# Patient Record
Sex: Male | Born: 1960 | Race: White | Hispanic: No | Marital: Married | Smoking: Never smoker
Health system: Southern US, Community
[De-identification: ages and names within clinical notes are randomized; demographics above are authoritative.]

---

## 2008-10-27 ENCOUNTER — Encounter: Admission: RE | Admit: 2008-10-27 | Discharge: 2008-10-27 | Payer: Self-pay | Admitting: Internal Medicine

## 2008-12-12 ENCOUNTER — Ambulatory Visit (HOSPITAL_COMMUNITY): Admission: RE | Admit: 2008-12-12 | Discharge: 2008-12-12 | Payer: Self-pay | Admitting: Internal Medicine

## 2009-04-28 ENCOUNTER — Encounter: Admission: RE | Admit: 2009-04-28 | Discharge: 2009-04-28 | Payer: Self-pay | Admitting: Internal Medicine

## 2011-12-05 ENCOUNTER — Ambulatory Visit
Admission: RE | Admit: 2011-12-05 | Discharge: 2011-12-05 | Disposition: A | Discharge status: Home / Self Care | Payer: BC Managed Care – PPO | Source: Ambulatory Visit | Attending: Internal Medicine | Admitting: Internal Medicine

## 2011-12-05 ENCOUNTER — Other Ambulatory Visit: Payer: Self-pay | Admitting: Internal Medicine

## 2011-12-05 DIAGNOSIS — R221 Localized swelling, mass and lump, neck: Secondary | ICD-10-CM

## 2011-12-05 MED ORDER — IOHEXOL 300 MG/ML  SOLN
75.0000 mL | Freq: Once | INTRAMUSCULAR | Status: AC | PRN
  Administered 2011-12-05: 75 mL via INTRAVENOUS

## 2014-09-07 IMAGING — CT CT NECK W/ CM
3 of 6 series · 16 of 33 positions shown, 19 images · IV contrast (75CC OMNI 300)
Comparison: None.

CLINICAL DATA: Palpable mass.  Swelling of the neck.

CT NECK WITH CONTRAST
TECHNIQUE: Multidetector CT imaging of the neck was performed with
intravenous contrast.
Contrast: 75mL OMNIPAQUE IOHEXOL 300 MG/ML  SOLN

[Series 4: thin cuts · axial · 0.43mm/px · z∈[-258,-27]mm · 8 of 744 slices shown, 10 images]
[im 83/744  soft-tissue]
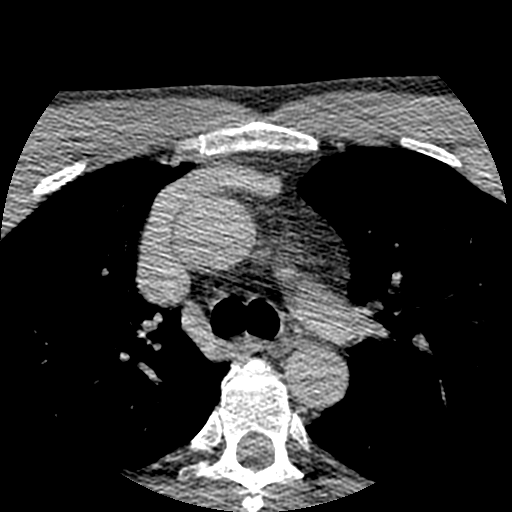
[im 83/744  bone]
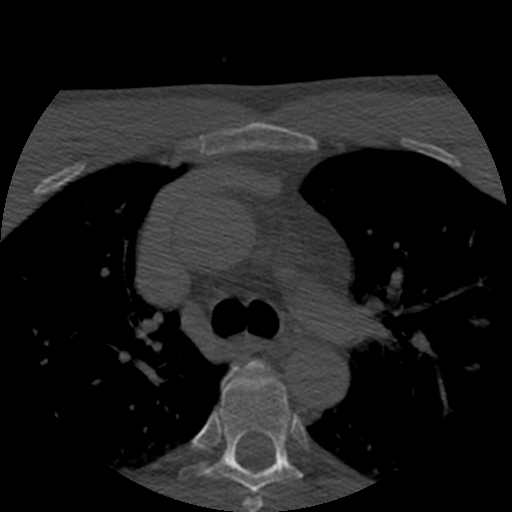
[im 166/744  bone]
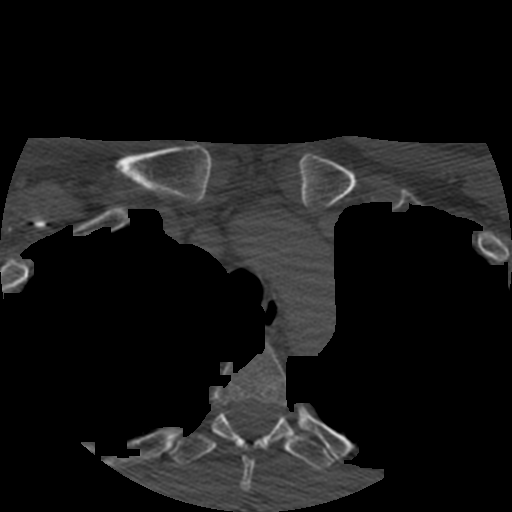
[im 248/744  bone]
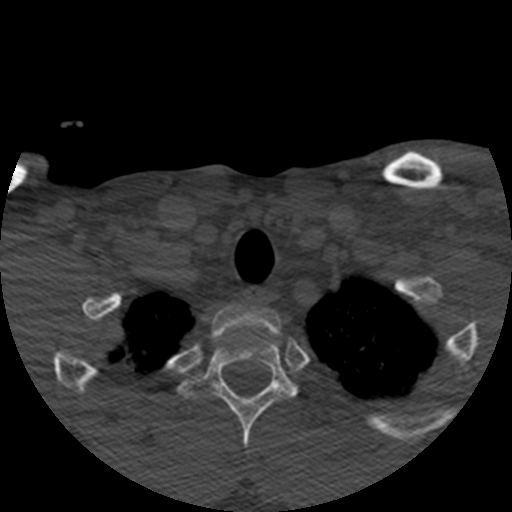
[im 331/744  bone]
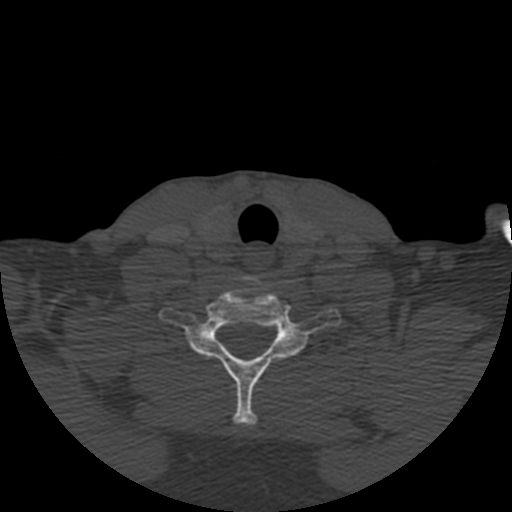
[im 413/744  soft-tissue]
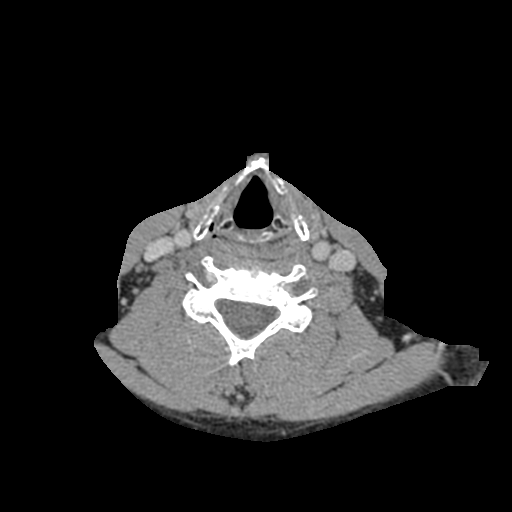
[im 413/744  bone]
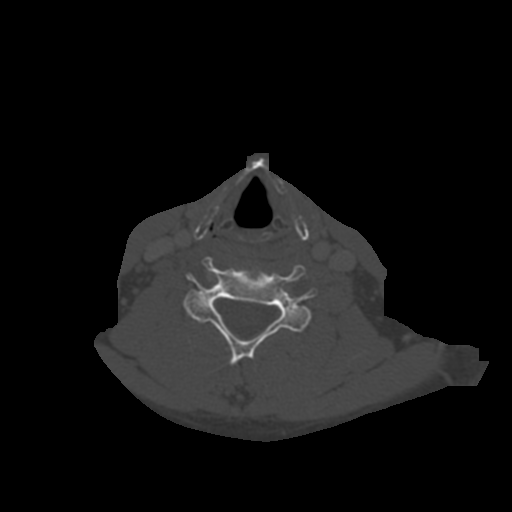
[im 496/744  bone]
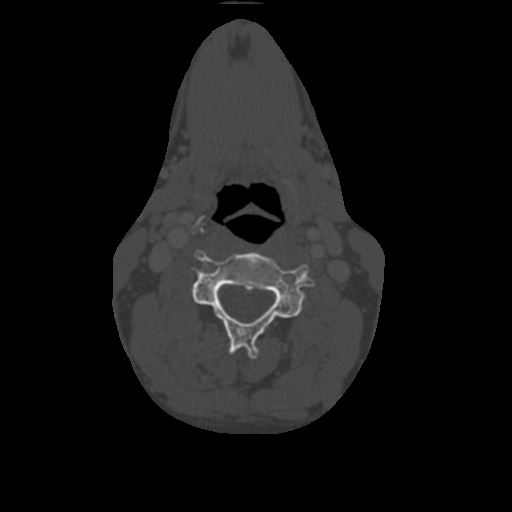
[im 578/744  bone]
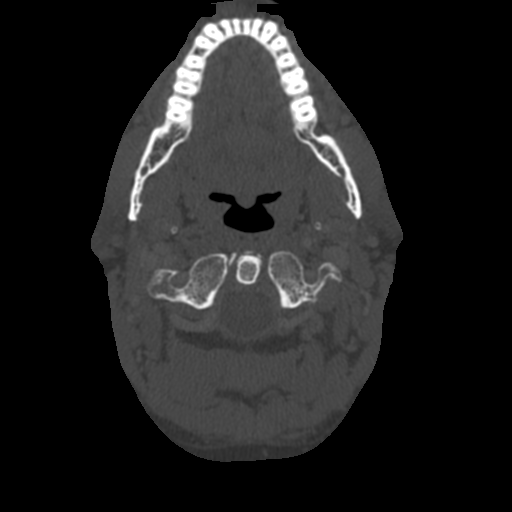
[im 661/744  bone]
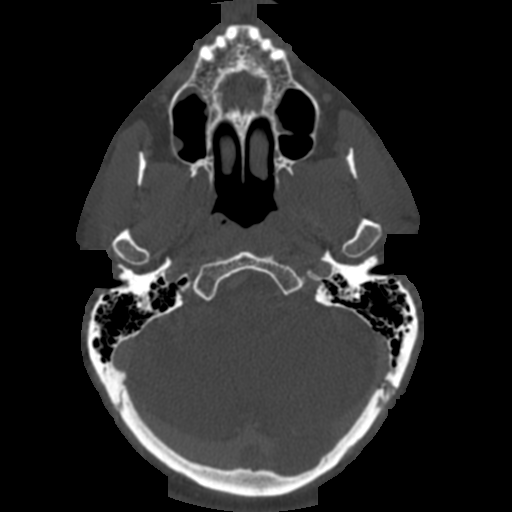

[Series 400: cor · coronal · 0.60mm/px · 3 of 118 slices shown]
[im 24/118  bone]
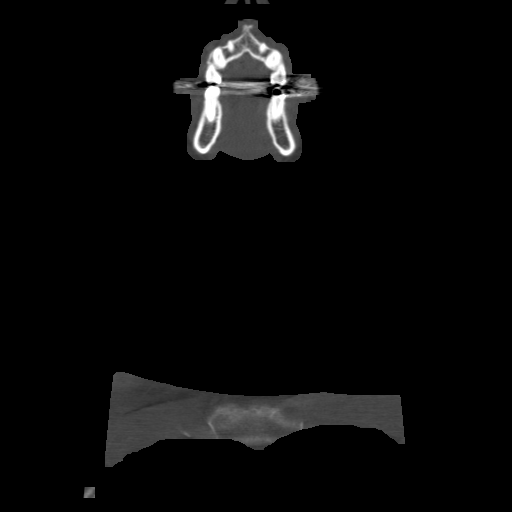
[im 47/118  bone]
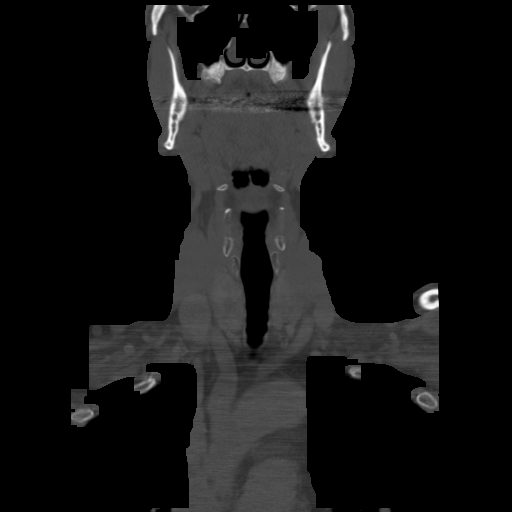
[im 71/118  bone]
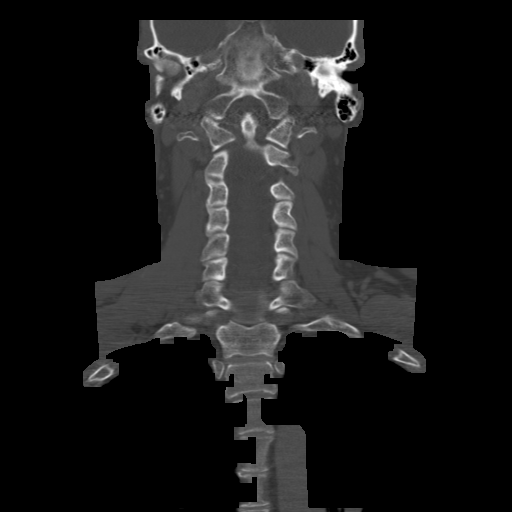

[Series 401: sag · sagittal · 0.60mm/px · 5 of 116 slices shown, 6 images]
[im 39/116  bone]
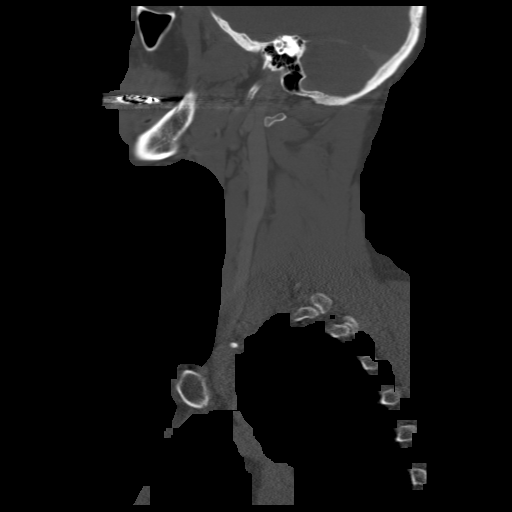
[im 48/116  bone]
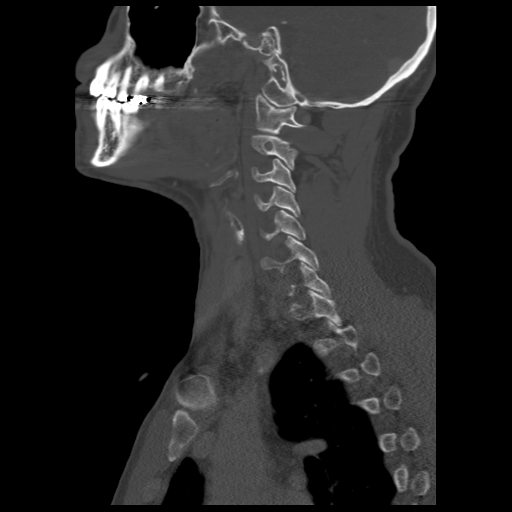
[im 58/116  soft-tissue]
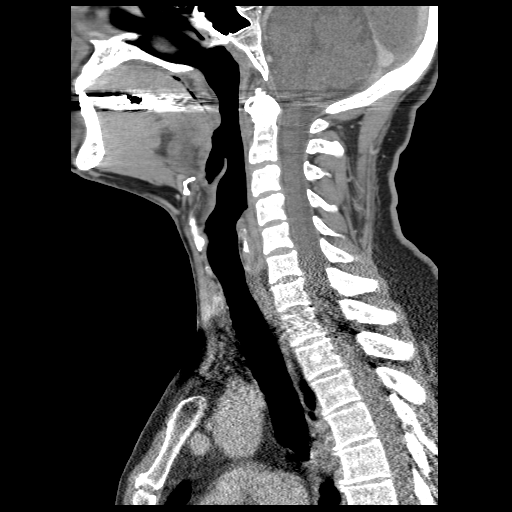
[im 58/116  bone]
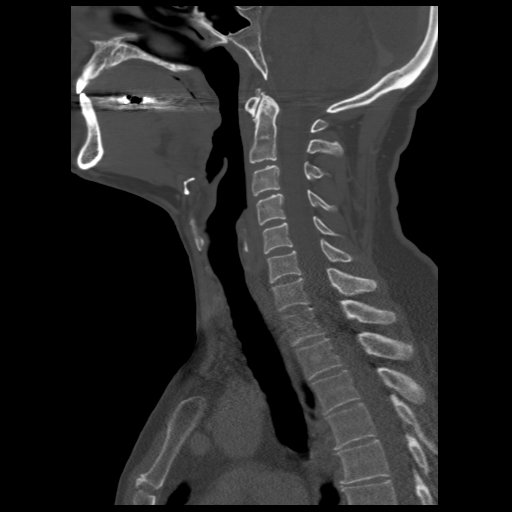
[im 68/116  bone]
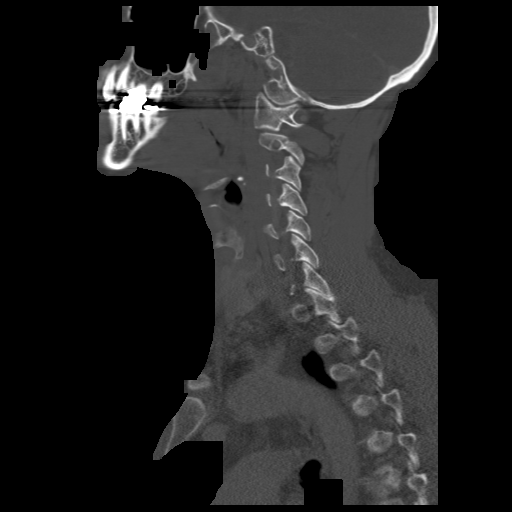
[im 77/116  bone]
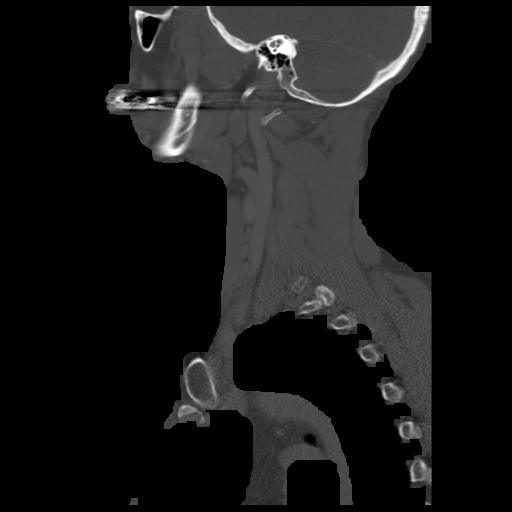

[16 of 33 positions shown; findings below may reference images not displayed]

FINDINGS: Lung apices are clear.  No superior mediastinal lesion.

Limited visualization of the intracranial contents does not show
any abnormality.

The parotid glands shows some atrophy.  No parotid lesion.
Submandibular glands do not show any abnormality.  No thyroid
lesion.

Skin markers are in place over the area is of concern.  There is no
evidence of underlying mass or adenopathy.  The left sided skin
marker overlies the normal-appearing sternocleidomastoid muscle and
external jugular vein.  The right-sided marker overlies the those
same structures, bed lower in the neck.  There are no
pathologically enlarged lymph nodes on either side of the neck.  No
arterial or venous pathology is seen.  No significant bony finding.
IMPRESSION: Negative exam.  No pathologic finding in the regions of concern.

## 2015-06-12 ENCOUNTER — Encounter (HOSPITAL_COMMUNITY): Payer: Self-pay | Admitting: Emergency Medicine

## 2015-06-12 ENCOUNTER — Emergency Department (HOSPITAL_COMMUNITY): Payer: Commercial Managed Care - PPO

## 2015-06-12 ENCOUNTER — Emergency Department (HOSPITAL_COMMUNITY)
Admission: EM | Admit: 2015-06-12 | Discharge: 2015-06-12 | Disposition: A | Discharge status: Home / Self Care | Payer: Commercial Managed Care - PPO | Attending: Emergency Medicine | Admitting: Emergency Medicine

## 2015-06-12 DIAGNOSIS — Z79899 Other long term (current) drug therapy: Secondary | ICD-10-CM | POA: Diagnosis not present

## 2015-06-12 DIAGNOSIS — K219 Gastro-esophageal reflux disease without esophagitis: Secondary | ICD-10-CM | POA: Insufficient documentation

## 2015-06-12 DIAGNOSIS — R1013 Epigastric pain: Secondary | ICD-10-CM | POA: Diagnosis present

## 2015-06-12 LAB — COMPREHENSIVE METABOLIC PANEL
ALBUMIN: 4.6 g/dL (ref 3.5–5.0)
ALT: 17 U/L (ref 17–63)
ANION GAP: 7 (ref 5–15)
AST: 14 U/L — AB (ref 15–41)
Alkaline Phosphatase: 61 U/L (ref 38–126)
BILIRUBIN TOTAL: 1.5 mg/dL — AB (ref 0.3–1.2)
BUN: 20 mg/dL (ref 6–20)
CALCIUM: 9.3 mg/dL (ref 8.9–10.3)
CO2: 26 mmol/L (ref 22–32)
Chloride: 104 mmol/L (ref 101–111)
Creatinine, Ser: 1.03 mg/dL (ref 0.61–1.24)
GFR calc non Af Amer: >60 mL/min (ref >60)
Glucose, Bld: 86 mg/dL (ref 65–99)
POTASSIUM: 3.5 mmol/L (ref 3.5–5.1)
SODIUM: 137 mmol/L (ref 135–145)
TOTAL PROTEIN: 7.4 g/dL (ref 6.5–8.1)

## 2015-06-12 LAB — URINALYSIS, ROUTINE W REFLEX MICROSCOPIC
Glucose, UA: NEGATIVE mg/dL
Hgb urine dipstick: NEGATIVE
Ketones, ur: >80 mg/dL — AB
LEUKOCYTES UA: NEGATIVE
NITRITE: NEGATIVE
Protein, ur: NEGATIVE mg/dL
SPECIFIC GRAVITY, URINE: 1.029 (ref 1.005–1.030)
pH: 6 (ref 5.0–8.0)

## 2015-06-12 LAB — CBC WITH DIFFERENTIAL/PLATELET
BASOS PCT: 0 %
Basophils Absolute: 0 10*3/uL (ref 0.0–0.1)
EOS ABS: 0.1 10*3/uL (ref 0.0–0.7)
Eosinophils Relative: 1 %
HCT: 47.4 % (ref 39.0–52.0)
HEMOGLOBIN: 16 g/dL (ref 13.0–17.0)
Lymphocytes Relative: 14 %
Lymphs Abs: 1 10*3/uL (ref 0.7–4.0)
MCH: 28.8 pg (ref 26.0–34.0)
MCHC: 33.8 g/dL (ref 30.0–36.0)
MCV: 85.3 fL (ref 78.0–100.0)
Monocytes Absolute: 0.4 10*3/uL (ref 0.1–1.0)
Monocytes Relative: 6 %
NEUTROS PCT: 79 %
Neutro Abs: 5.4 10*3/uL (ref 1.7–7.7)
Platelets: 186 10*3/uL (ref 150–400)
RBC: 5.56 MIL/uL (ref 4.22–5.81)
RDW: 12.7 % (ref 11.5–15.5)
WBC: 6.9 10*3/uL (ref 4.0–10.5)

## 2015-06-12 LAB — LIPASE, BLOOD: Lipase: 30 U/L (ref 11–51)

## 2015-06-12 MED ORDER — SUCRALFATE 1 G PO TABS: 1.0000 g | ORAL_TABLET | Freq: Four times a day (QID) | ORAL | Status: DC

## 2015-06-12 MED ORDER — ONDANSETRON HCL 4 MG/2ML IJ SOLN
4.0000 mg | Freq: Once | INTRAMUSCULAR | Status: AC
  Administered 2015-06-12: 4 mg via INTRAVENOUS
  Filled 2015-06-12: qty 2

## 2015-06-12 MED ORDER — GI COCKTAIL ~~LOC~~
30.0000 mL | Freq: Once | ORAL | Status: AC
  Administered 2015-06-12: 30 mL via ORAL
  Filled 2015-06-12: qty 30

## 2015-06-12 MED ORDER — SODIUM CHLORIDE 0.9 % IV BOLUS (SEPSIS)
1000.0000 mL | Freq: Once | INTRAVENOUS | Status: AC
  Administered 2015-06-12: 1000 mL via INTRAVENOUS

## 2015-06-12 MED ORDER — PANTOPRAZOLE SODIUM 40 MG IV SOLR
40.0000 mg | Freq: Once | INTRAVENOUS | Status: AC
  Administered 2015-06-12: 40 mg via INTRAVENOUS
  Filled 2015-06-12: qty 40

## 2015-06-12 MED ORDER — SODIUM CHLORIDE 0.9 % IV SOLN
INTRAVENOUS | Status: DC
  Administered 2015-06-12: 20:00:00 via INTRAVENOUS

## 2015-06-12 NOTE — ED Notes (Signed)
Pt c/o low central chest pain during cough radiating to right lower rib cage area with x 10-12 weeks. No pain when not coughing. Soreness to RLQ. Pt states cough wakes him at night, worse at night, pt props himself up on 2-3 pillows to sleep. Pt also reports "churning" epigastric discomfort. Pain reproducible with palpation. No smoking history.

## 2015-06-12 NOTE — ED Notes (Signed)
Patient aware urine sample is needed, urinal is at the bedside.  

## 2015-06-12 NOTE — Discharge Instructions (Signed)
Esophagitis °Esophagitis is inflammation of the esophagus. The esophagus is the tube that carries food and liquids from your mouth to your stomach. Esophagitis can cause soreness or pain in the esophagus. This condition can make it difficult and painful to swallow.  °CAUSES °Most causes of esophagitis are not serious. Common causes of this condition include: °· Gastroesophageal reflux disease (GERD). This is when stomach contents move back up into the esophagus (reflux). °· Repeated vomiting. °· An allergic-type reaction, especially caused by food allergies (eosinophilic esophagitis). °· Injury to the esophagus by swallowing large pills with or without water, or swallowing certain types of medicines. °· Swallowing (ingesting) harmful chemicals, such as household cleaning products. °· Heavy alcohol use. °· An infection of the esophagus. This most often occurs in people who have a weakened immune system. °· Radiation or chemotherapy treatment for cancer. °· Certain diseases such as sarcoidosis, Crohn disease, and scleroderma. °SYMPTOMS °Symptoms of this condition include: °· Difficult or painful swallowing. °· Pain with swallowing acidic liquids, such as citrus juices. °· Pain with burping. °· Chest pain. °· Difficulty breathing. °· Nausea. °· Vomiting. °· Pain in the abdomen. °· Weight loss. °· Ulcers in the mouth. °· Patches of white material in the mouth (candidiasis). °· Fever. °· Coughing up blood or vomiting blood. °· Stool that is black, tarry, or bright red. °DIAGNOSIS °Your health care provider will take a medical history and perform a physical exam. You may also have other tests, including: °· An endoscopy to examine your stomach and esophagus with a small camera. °· A test that measures the acidity level in your esophagus. °· A test that measures how much pressure is on your esophagus. °· A barium swallow or modified barium swallow to show the shape, size, and functioning of your esophagus. °· Allergy  tests. °TREATMENT °Treatment for this condition depends on the cause of your esophagitis. In some cases, steroids or other medicines may be given to help relieve your symptoms or to treat the underlying cause of your condition. You may have to make some lifestyle changes, such as: °· Avoiding alcohol. °· Quitting smoking. °· Changing your diet. °· Exercising. °· Changing your sleep habits and your sleep environment. °HOME CARE INSTRUCTIONS °Take these actions to decrease your discomfort and to help avoid complications. °Diet °· Follow a diet as recommended by your health care provider. This may involve avoiding foods and drinks such as: °¨ Coffee and tea (with or without caffeine). °¨ Drinks that contain alcohol. °¨ Energy drinks and sports drinks. °¨ Carbonated drinks or sodas. °¨ Chocolate and cocoa. °¨ Peppermint and mint flavorings. °¨ Garlic and onions. °¨ Horseradish. °¨ Spicy and acidic foods, including peppers, chili powder, curry powder, vinegar, hot sauces, and barbecue sauce. °¨ Citrus fruit juices and citrus fruits, such as oranges, lemons, and limes. °¨ Tomato-based foods, such as red sauce, chili, salsa, and pizza with red sauce. °¨ Fried and fatty foods, such as donuts, french fries, potato chips, and high-fat dressings. °¨ High-fat meats, such as hot dogs and fatty cuts of red and white meats, such as rib eye steak, sausage, ham, and bacon. °¨ High-fat dairy items, such as whole milk, butter, and cream cheese. °· Eat small, frequent meals instead of large meals. °· Avoid drinking large amounts of liquid with your meals. °· Avoid eating meals during the 2-3 hours before bedtime. °· Avoid lying down right after you eat. °· Do not exercise right after you eat. °· Avoid foods and drinks that seem to   make your symptoms worse. °General Instructions °· Pay attention to any changes in your symptoms. °· Take over-the-counter and prescription medicines only as told by your health care provider. Do not take  aspirin, ibuprofen, or other NSAIDs unless your health care provider told you to do so. °· If you have trouble taking pills, use a pill splitter to decrease the size of the pill. This will decrease the chance of the pill getting stuck or injuring your esophagus on the way down. Also, drink water after you take a pill. °· Do not use any tobacco products, including cigarettes, chewing tobacco, and e-cigarettes. If you need help quitting, ask your health care provider. °· Wear loose-fitting clothing. Do not wear anything tight around your waist that causes pressure on your abdomen. °· Raise (elevate) the head of your bed about 6 inches (15 cm). °· Try to reduce your stress, such as with yoga or meditation. If you need help reducing stress, ask your health care provider. °· If you are overweight, reduce your weight to an amount that is healthy for you. Ask your health care provider for guidance about a safe weight loss goal. °· Keep all follow-up visits as told by your health care provider. This is important. °SEEK MEDICAL CARE IF: °· You have new symptoms. °· You have unexplained weight loss. °· You have difficulty swallowing, or it hurts to swallow. °· You have wheezing or a persistent cough. °· Your symptoms do not improve with treatment. °· You have frequent heartburn for more than two weeks. °SEEK IMMEDIATE MEDICAL CARE IF: °· You have severe pain in your arms, neck, jaw, teeth, or back. °· You feel sweaty, dizzy, or light-headed. °· You have chest pain or shortness of breath. °· You vomit and your vomit looks like blood or coffee grounds. °· Your stool is bloody or black. °· You have a fever. °· You cannot swallow, drink, or eat. °  °This information is not intended to replace advice given to you by your health care provider. Make sure you discuss any questions you have with your health care provider. °  °Document Released: 02/11/2004 Document Revised: 09/24/2014 Document Reviewed: 04/30/2014 °Elsevier Interactive  Patient Education ©2016 [employer] ° °

## 2015-06-12 NOTE — ED Provider Notes (Signed)
CSN: [id number]     Arrival date & time 06/12/15  1633 History   First MD Initiated Contact with Patient 06/12/15 1815     Chief Complaint  Patient presents with  . Cough  . Abdominal Pain    HPI Pt has been having pain in the epigastrium region and right side for several weeks (10-12).  He has been having an acid feeling and a churning sensation in his stomach.  He has been having trouble with a cough as well that he thinks could be related to the acid reflux.  When he coughs he has soreness in the chest area.  Initially he tried some allergy meds but it did not help.  He saw his primary care doctor who did an xray of his chest and he was treated with a z pack.  He followed up with his doctor who suggested an ENT doctor.  He also called for an outpatient GI follow up but the sx are not for a couple of months.  No vomiting.  No diarrhea.  No weight loss or fevers. History reviewed. No pertinent past medical history. History reviewed. No pertinent past surgical history. History reviewed. No pertinent family history. Social History  Substance Use Topics  . Smoking status: Never Smoker   . Smokeless tobacco: None  . Alcohol Use: No    Review of Systems  All other systems reviewed and are negative.     Allergies  Review of patient's allergies indicates no known allergies.  Home Medications   Prior to Admission medications   Medication Sig Start Date End Date Taking? Authorizing Provider  esomeprazole (NEXIUM) 40 MG capsule Take 40 mg by mouth daily at 12 noon.   Yes Historical Provider, MD  rizatriptan (MAXALT-MLT) 10 MG disintegrating tablet Take 10 mg by mouth daily as needed for migraine. May repeat in 2 hours if needed   Yes Historical Provider, MD  sucralfate (CARAFATE) 1 g tablet Take 1 tablet (1 g total) by mouth 4 (four) times daily. 06/12/15   Dorie Rank, MD   BP 110/73 mmHg  Pulse 58  Temp(Src) 98.2 F (36.8 C) (Oral)  Resp 18  SpO2 99% Physical Exam  Constitutional:  He appears well-developed and well-nourished. No distress.  HENT:  Head: Normocephalic and atraumatic.  Right Ear: External ear normal.  Left Ear: External ear normal.  Eyes: Conjunctivae are normal. Right eye exhibits no discharge. Left eye exhibits no discharge. No scleral icterus.  Neck: Neck supple. No tracheal deviation present.  Cardiovascular: Normal rate, regular rhythm and intact distal pulses.   Pulmonary/Chest: Effort normal and breath sounds normal. No stridor. No respiratory distress. He has no wheezes. He has no rales.  Abdominal: Soft. Bowel sounds are normal. He exhibits no distension. There is no tenderness. There is no rebound and no guarding.  Musculoskeletal: He exhibits no edema or tenderness.  Neurological: He is alert. He has normal strength. No cranial nerve deficit (no facial droop, extraocular movements intact, no slurred speech) or sensory deficit. He exhibits normal muscle tone. He displays no seizure activity. Coordination normal.  Skin: Skin is warm and dry. No rash noted.  Psychiatric: He has a normal mood and affect.  Nursing note and vitals reviewed.   ED Course  Procedures (including critical care time) Labs Review Labs Reviewed  COMPREHENSIVE METABOLIC PANEL - Abnormal; Notable for the following:    AST 14 (*)    Total Bilirubin 1.5 (*)    All other components within normal limits  URINALYSIS, ROUTINE W REFLEX MICROSCOPIC (NOT AT [facility]) - Abnormal; Notable for the following:    Color, Urine AMBER (*)    Bilirubin Urine SMALL (*)    Ketones, ur >80 (*)    All other components within normal limits  LIPASE, BLOOD  CBC WITH DIFFERENTIAL/PLATELET    Imaging Review Dg Chest 2 View  06/12/2015  CLINICAL DATA:  Chronic dry cough for 12 weeks EXAM: CHEST  2 VIEW COMPARISON:  None. FINDINGS: The heart size and mediastinal contours are within normal limits. Both lungs are clear. The visualized skeletal structures are unremarkable. IMPRESSION: No active  cardiopulmonary disease. Electronically Signed   By: Kathreen Devoid   On: 06/12/2015 17:44   I have personally reviewed and evaluated these images and lab results as part of my medical decision-making.   EKG Interpretation   Date/Time:  Friday Jun 12 2015 16:41:49 EDT Ventricular Rate:  72 PR Interval:  149 QRS Duration: 93 QT Interval:  385 QTC Calculation: 421 R Axis:   58 Text Interpretation:  Sinus rhythm No old tracing to compare Confirmed by  Gera Inboden  MD-J, Kambryn Dapolito [id number]) on 06/12/2015 6:28:00 PM      MDM   Final diagnoses:  Gastroesophageal reflux disease, esophagitis presence not specified    Labs are normal.  NO acute findings on CXR.  Sx likely related to GERD.  Will continue PPI.  Add on carafate to see if that helps.  Follow up with GI as planned.    Dorie Rank, MD 06/12/15 2034

## 2015-06-23 DIAGNOSIS — K219 Gastro-esophageal reflux disease without esophagitis: Secondary | ICD-10-CM | POA: Insufficient documentation

## 2015-06-23 DIAGNOSIS — R1013 Epigastric pain: Secondary | ICD-10-CM | POA: Insufficient documentation

## 2015-06-24 ENCOUNTER — Encounter: Payer: Self-pay | Admitting: Gastroenterology

## 2015-06-24 ENCOUNTER — Ambulatory Visit (INDEPENDENT_AMBULATORY_CARE_PROVIDER_SITE_OTHER): Payer: Commercial Managed Care - PPO | Admitting: Gastroenterology

## 2015-06-24 VITALS — BP 114/70 | HR 72 | Ht 74.0 in | Wt 190.0 lb

## 2015-06-24 DIAGNOSIS — R1011 Right upper quadrant pain: Secondary | ICD-10-CM

## 2015-06-24 DIAGNOSIS — R079 Chest pain, unspecified: Secondary | ICD-10-CM

## 2015-06-24 DIAGNOSIS — R053 Chronic cough: Secondary | ICD-10-CM

## 2015-06-24 DIAGNOSIS — R05 Cough: Secondary | ICD-10-CM

## 2015-06-24 DIAGNOSIS — K219 Gastro-esophageal reflux disease without esophagitis: Secondary | ICD-10-CM | POA: Diagnosis not present

## 2015-06-24 MED ORDER — DICYCLOMINE HCL 10 MG PO CAPS: 10.0000 mg | ORAL_CAPSULE | Freq: Three times a day (TID) | ORAL | Status: AC | PRN

## 2015-06-24 MED ORDER — ESOMEPRAZOLE MAGNESIUM 40 MG PO CPDR: 40.0000 mg | DELAYED_RELEASE_CAPSULE | Freq: Two times a day (BID) | ORAL | Status: DC

## 2015-06-24 NOTE — Patient Instructions (Addendum)
We have sent the following medications to your pharmacy for you to pick up at your convenience:Neixum 40 mg to increase to twice daily x 3 months then back down to once daily, and bentyl.  You have been scheduled for an abdominal ultrasound at [facility] (1st floor of hospital) on 06/29/15 at 8:30am. Please arrive 15 minutes prior to your appointment for registration. Make certain not to have anything to eat or drink 6 hours prior to your appointment. Should you need to reschedule your appointment, please contact radiology at [phone]. This test typically takes about 30 minutes to perform.  Patient advised to avoid spicy, acidic, citrus, chocolate, mints, fruit and fruit juices.  Limit the intake of caffeine, alcohol and Soda.  Don't exercise too soon after eating.  Don't lie down within 3-4 hours of eating.  Elevate the head of your bed.  The pulmonary office will contact regarding your referral to schedule your appointment date and time.  Thank you for choosing me and [facility].  Pricilla Riffle. Dagoberto Ligas., MD., Marval Regal

## 2015-06-24 NOTE — Progress Notes (Addendum)
    History of Present Illness: This is a 55 year old male who is self referred for the evaluation of a chronic cough, chest pain and right upper quadrant pain. Patient has a history of GERD and he describes frequent postprandial belching when his symptoms are active. He's previously been evaluated by Dr. Earlie Raveling and the patient relates that he underwent colonoscopy and upper endoscopy about 2 years ago. He is not aware of any findings. Unfortunately we do not have records available today. For the past 3 months the patient has had an ongoing dry cough. He was evaluated by his PCP and he was evaluated in the ED. Chest x-ray and blood work in the ED were unremarkable on May 26. Patient states he has pain along his right lower chest and lower sternum during coughing episodes. He has been taking Nexium daily for 3 months and has occasional mild belching but no other typical reflux symptoms. He has occasional mild right upper quadrant discomfort as well as frequent "gurgling" in his upper abdomen. He notes these symptoms particularly active at night. They are generally not associated with any other symptom, meals or bowel movements. He states he was evaluated by Dr. Arville Care, ENT, yesterday and states the evaluation was negative with no abnormalities noted on exam however we do not have those records. Denies weight loss, constipation, diarrhea, change in stool caliber, melena, hematochezia, nausea, vomiting, dysphagia.  Review of Systems: Pertinent positive and negative review of systems were noted in the above HPI section. All other review of systems were otherwise negative.  Current Medications, Allergies, Past Medical History, Past Surgical History, Family History and Social History were reviewed in [employer] record.  Physical Exam: General: Well developed, well nourished, no acute distress Head: Normocephalic and atraumatic Eyes:  sclerae anicteric, EOMI Ears: Normal  auditory acuity Mouth: No deformity or lesions Neck: Supple, no masses or thyromegaly Lungs: Clear throughout to auscultation Heart: Regular rate and rhythm; no murmurs, rubs or bruits Abdomen: Soft, non tender and non distended. No masses, hepatosplenomegaly or hernias noted. Normal Bowel sounds Musculoskeletal: Symmetrical with no gross deformities  Skin: No lesions on visible extremities Pulses:  Normal pulses noted Extremities: No clubbing, cyanosis, edema or deformities noted Neurological: Alert oriented x 4, grossly nonfocal Cervical Nodes:  No significant cervical adenopathy Inguinal Nodes: No significant inguinal adenopathy Psychological:  Alert and cooperative. Normal mood and affect  Assessment and Recommendations:  1. Cough for 3 months. Does not appear to be GERD related but could be LPR. Pulm referral to further evaluate cough and follow-up with PCP. Trial of Nexium 40 mg po bid for 3 months for the possibility of LPR. Request records from Dr. Earlean Shawl including prior EGD and colonoscopy.  2. GERD. Follow all standard antireflux measures and Nexium bid as above.  3. RUQ pain, mild associated with borborygmi. Schedule abdominal ultrasound. Trial of dicyclomine 10 mg 3 times a day when necessary.  4. Right lower chest/sternal pain occuring with cough. Musculoskeletal pain. Further follow-up per PCP.   Addendum on 06/30/2015. Records from Dr. Earlean Shawl received, reviewed. Colonoscopy 11/19/2012 for screening showed 2 adenomatous colon polyps, small internal hemorrhoids. EGD 11/19/2012 was performed for increase abdominal borborygmi, cramping, discomfort. showed non erosive antral gastritis, CLO was negative. Duodenal biopsies were normal. Surveillance colonoscopy recommended in 11/2017.

## 2015-06-25 ENCOUNTER — Telehealth: Payer: Self-pay

## 2015-06-25 NOTE — Telephone Encounter (Signed)
Received fax PA request from [facility]. Called [phone] and did PA over the phone. She states it went in to pharmacist review and they will notify me by fax there answer.

## 2015-06-29 ENCOUNTER — Ambulatory Visit (HOSPITAL_COMMUNITY)
Admission: RE | Admit: 2015-06-29 | Discharge: 2015-06-29 | Disposition: A | Discharge status: Home / Self Care | Payer: Commercial Managed Care - PPO | Source: Ambulatory Visit | Attending: Gastroenterology | Admitting: Gastroenterology

## 2015-06-29 ENCOUNTER — Telehealth: Payer: Self-pay | Admitting: Gastroenterology

## 2015-06-29 ENCOUNTER — Ambulatory Visit (HOSPITAL_COMMUNITY): Payer: Commercial Managed Care - PPO

## 2015-06-29 DIAGNOSIS — N281 Cyst of kidney, acquired: Secondary | ICD-10-CM | POA: Diagnosis not present

## 2015-06-29 DIAGNOSIS — D1803 Hemangioma of intra-abdominal structures: Secondary | ICD-10-CM | POA: Insufficient documentation

## 2015-06-29 DIAGNOSIS — R1011 Right upper quadrant pain: Secondary | ICD-10-CM | POA: Insufficient documentation

## 2015-06-30 MED ORDER — OMEPRAZOLE 40 MG PO CPDR: 40.0000 mg | DELAYED_RELEASE_CAPSULE | Freq: Two times a day (BID) | ORAL | Status: AC

## 2015-06-30 NOTE — Telephone Encounter (Signed)
Patient advised that [facility] (phone [phone]) has denied his Nexium. Will will send omeprazole 40 mg twice daily in its place x 3 months in hopes that insurance will cover this prescription. Patient verbalizes understanding. I have advised that should insurance not cover this medication, he should contact his insurance company to find out which PPI's they prefer.

## 2015-06-30 NOTE — Telephone Encounter (Signed)
Patient advised that we did PA over the phone on 06-25-15 and are awaiting a response. Patient verbalizes understanding.

## 2015-08-03 ENCOUNTER — Ambulatory Visit (INDEPENDENT_AMBULATORY_CARE_PROVIDER_SITE_OTHER): Payer: Commercial Managed Care - PPO | Admitting: Pulmonary Disease

## 2015-08-03 ENCOUNTER — Encounter: Payer: Self-pay | Admitting: Pulmonary Disease

## 2015-08-03 ENCOUNTER — Telehealth: Payer: Self-pay | Admitting: Pulmonary Disease

## 2015-08-03 VITALS — BP 116/74 | HR 82 | Ht 74.0 in | Wt 190.2 lb

## 2015-08-03 DIAGNOSIS — R05 Cough: Secondary | ICD-10-CM | POA: Diagnosis not present

## 2015-08-03 DIAGNOSIS — R059 Cough, unspecified: Secondary | ICD-10-CM | POA: Insufficient documentation

## 2015-08-03 MED ORDER — AZELASTINE-FLUTICASONE 137-50 MCG/ACT NA SUSP: 1.0000 | Freq: Two times a day (BID) | NASAL | Status: AC

## 2015-08-03 MED ORDER — CHLORPHENIRAMINE MALEATE 4 MG PO TABS: 8.0000 mg | ORAL_TABLET | Freq: Three times a day (TID) | ORAL | Status: AC

## 2015-08-03 NOTE — Telephone Encounter (Signed)
Per 08/03/15 OV: Plan: - Chlorpeniramine 8 mg tid - Dymista nasal spray - PFTs ---  LMTCB. Pt needs PFT's upon return

## 2015-08-03 NOTE — Telephone Encounter (Signed)
Pt returned call needing a pft @ next ov that was missed, going to sched that.Dean Francis

## 2015-08-03 NOTE — Patient Instructions (Signed)
We will start you on chlorpheniramine pills 8 mg PO tid. We will start dymista nasal spray 1 puff each nostril twice daily. Continue your acid suppression medications.  Return to clinic in 1-2 months.

## 2015-08-03 NOTE — Progress Notes (Signed)
   Subjective:    Patient ID: Dean Francis, male    DOB: 01/28/1960, 55 y.o.   MRN: [id number]  HPI Consult for evaluation of chronic cough  Dean Francis is a 55 year old with no significant past medical history. He said cough since March 2017. He thinks this may have been set off by allergies, pollen. It was associated with dyspnea, wheezing. Respiratory symptoms have resolved but the cough still persists. He was evaluated by GI Dr. Fuller Plan and started on PPI bid with no improvement in symptoms. He was also evaluated by ENT Dr. Constance Holster who did not find any abnormality.  Social History: He is a never smoker, no alcohol, drug use. He works as a Hydrographic surveyor.  Family History: Father-throat cancer  History reviewed. No pertinent past medical history.    Current outpatient prescriptions:  .  dicyclomine (BENTYL) 10 MG capsule, Take 1 capsule (10 mg total) by mouth 3 (three) times daily as needed for spasms., Disp: 90 capsule, Rfl: 5 .  omeprazole (PRILOSEC) 40 MG capsule, Take 1 capsule (40 mg total) by mouth 2 (two) times daily., Disp: 180 capsule, Rfl: 0 .  rizatriptan (MAXALT-MLT) 10 MG disintegrating tablet, Take 10 mg by mouth daily as needed for migraine. May repeat in 2 hours if needed, Disp: , Rfl:  .  Azelastine-Fluticasone (DYMISTA) 137-50 MCG/ACT SUSP, Place 1 spray into the nose 2 (two) times daily., Disp: 1 Bottle, Rfl: 2 .  chlorpheniramine (CHLOR-TRIMETON) 4 MG tablet, Take 2 tablets (8 mg total) by mouth 3 (three) times daily., Disp: 180 tablet, Rfl: 2  Review of Systems Cough, no sputum production, dyspnea, wheezing, hemoptysis. No chest pain, palpitation. No nausea, vomiting, diarrhea, constipation. No fevers, chills, loss of weight, loss of appetite. All other review of systems are negative.    Objective:   Physical Exam Blood pressure 116/74, pulse 82, height 6\' 2"  (1.88 m), weight 190 lb 3.2 oz (86.274 kg), SpO2 98 %. Gen: No apparent  distress Neuro: No gross focal deficits. HEENT: No JVD, lymphadenopathy, thyromegaly. RS: Clear, No wheeze or crackles CVS: S1-S2 heard, no murmurs rubs gallops. Abdomen: Soft, positive bowel sounds. Musculoskeletal: No edema.    Assessment & Plan:  Chronic cough  Likely upper airway cough syndrome. He may have a component of LPR but has not responded to high dose PPI. I will start him on antihistamine chlorpheniramine and dymista nasal spray. I cautioned him that the chlorpheniramine may make him sleepy and was advised not to drive if very symptomatic.  He will be scheduled for PFTs with bronchodilator response. I will hold off on starting any inhalers at present.  I educated him on behavioral changes to deal with cough including conscious suppression of the urge to cough, use of throat lozenges.  Plan: - Chlorpeniramine 8 mg tid - Dymista nasal spray - PFTs  Return in 1-2 months  Marshell Garfinkel MD [facility] Pager [phone] If no answer or after 3pm call: [phone] 08/03/2015, 1:44 PM

## 2015-08-11 ENCOUNTER — Ambulatory Visit: Payer: Self-pay | Admitting: Gastroenterology

## 2015-08-17 ENCOUNTER — Telehealth: Payer: Self-pay | Admitting: Gastroenterology

## 2015-08-17 NOTE — Telephone Encounter (Signed)
See my note and pulm note. Pulm feels this is upper airway cough syndrome. Very unlikely cough is related to GERD LPR but should stay on PPI bid for a full 3 months. EGD by Dr. Earlean Shawl was performed in 2014 and did not show any GERD related findings. I do not recommend repeating EGD at this time. I recommend he follow up with Pulm for mgmt for his cough.

## 2015-08-17 NOTE — Telephone Encounter (Signed)
Patient reports he has continued cough.  He did see pulmonary as requested by Dr. Fuller Plan.  They started him on nasal spray and zyrtec.  He reports no improvement with his symptoms.  He asks if he should have an EGD?

## 2015-08-17 NOTE — Telephone Encounter (Signed)
Patient notified of response.  He will call back for additional GI concerns

## 2015-09-15 ENCOUNTER — Ambulatory Visit: Payer: Commercial Managed Care - PPO | Admitting: Pulmonary Disease

## 2015-09-18 ENCOUNTER — Ambulatory Visit: Payer: Commercial Managed Care - PPO | Admitting: Pulmonary Disease

## 2015-09-22 ENCOUNTER — Telehealth: Payer: Self-pay | Admitting: Gastroenterology

## 2015-09-22 NOTE — Telephone Encounter (Signed)
Patient reports continued cough and indigestion.  See phone notes from 08/17/15.  Patient needs to follow up with pulmonary.  He wants to discuss an EGD.  He is advised to schedule an office visit after he has completed his pulmonary workup.  He will come in and discuss 11/03/15

## 2015-10-08 ENCOUNTER — Ambulatory Visit (INDEPENDENT_AMBULATORY_CARE_PROVIDER_SITE_OTHER): Payer: Commercial Managed Care - PPO | Admitting: Pulmonary Disease

## 2015-10-08 ENCOUNTER — Encounter: Payer: Self-pay | Admitting: Pulmonary Disease

## 2015-10-08 ENCOUNTER — Encounter (INDEPENDENT_AMBULATORY_CARE_PROVIDER_SITE_OTHER): Payer: Commercial Managed Care - PPO | Admitting: Pulmonary Disease

## 2015-10-08 VITALS — BP 114/82 | HR 63 | Ht 72.0 in | Wt 189.0 lb

## 2015-10-08 DIAGNOSIS — R059 Cough, unspecified: Secondary | ICD-10-CM

## 2015-10-08 DIAGNOSIS — R05 Cough: Secondary | ICD-10-CM

## 2015-10-08 LAB — PULMONARY FUNCTION TEST
DL/VA % PRED: 81 %
DL/VA: 3.86 ml/min/mmHg/L
DLCO COR: 29.98 ml/min/mmHg
DLCO cor % pred: 85 %
DLCO unc % pred: 85 %
DLCO unc: 29.98 ml/min/mmHg
FEF 25-75 Post: 4.67 L/sec
FEF 25-75 Pre: 4 L/sec
FEF2575-%CHANGE-POST: 16 %
FEF2575-%PRED-PRE: 113 %
FEF2575-%Pred-Post: 132 %
FEV1-%Change-Post: 3 %
FEV1-%PRED-PRE: 110 %
FEV1-%Pred-Post: 113 %
FEV1-POST: 4.67 L
FEV1-PRE: 4.53 L
FEV1FVC-%CHANGE-POST: 5 %
FEV1FVC-%Pred-Pre: 100 %
FEV6-%Change-Post: -2 %
FEV6-%PRED-PRE: 112 %
FEV6-%Pred-Post: 110 %
FEV6-POST: 5.67 L
FEV6-PRE: 5.79 L
FEV6FVC-%Change-Post: 0 %
FEV6FVC-%PRED-POST: 104 %
FEV6FVC-%PRED-PRE: 103 %
FVC-%Change-Post: -2 %
FVC-%PRED-POST: 106 %
FVC-%PRED-PRE: 109 %
FVC-POST: 5.68 L
FVC-PRE: 5.83 L
POST FEV6/FVC RATIO: 100 %
PRE FEV1/FVC RATIO: 78 %
PRE FEV6/FVC RATIO: 99 %
Post FEV1/FVC ratio: 82 %
RV % PRED: 117 %
RV: 2.59 L
TLC % PRED: 119 %
TLC: 8.81 L

## 2015-10-08 LAB — NITRIC OXIDE: Nitric Oxide: 23

## 2015-10-08 NOTE — Patient Instructions (Signed)
Please stop the zyrtec and start chlorpheniramine 8 mg tid. Continue the dymista nasal spray. Please call with me in 2 weeks to report any change in symptoms.  PFTs reviewed with you today.  Return in 1 months

## 2015-10-08 NOTE — Progress Notes (Signed)
Dean Francis    [id number]    [birth year]  Primary Care Physician:RAMACHANDRAN,AJITH, MD  Referring Physician: Merrilee Seashore, MD [address] [place], [place] [address]  Chief complaint:   Follow up for chronic cough  HPI: Dean Francis is a 55 year old with no significant past medical history. He said cough since March 2017. He thinks this may have been set off by allergies, pollen. It was associated with dyspnea, wheezing. Respiratory symptoms have resolved but the cough still persists. He was evaluated by GI Dr. Fuller Plan and started on PPI bid with no improvement in symptoms. He was also evaluated by ENT Dr. Constance Holster who did not find any abnormality.  We told him to take antihistamine chlorpheniramine antihistamine nasal spray at last visit. However he is not taking the chlorpheniramine as he was under the impression this is the same as Zyrtec.   Outpatient Encounter Prescriptions as of 10/08/2015  Medication Sig  . Azelastine-Fluticasone (DYMISTA) 137-50 MCG/ACT SUSP Place 1 spray into the nose 2 (two) times daily.  . chlorpheniramine (CHLOR-TRIMETON) 4 MG tablet Take 2 tablets (8 mg total) by mouth 3 (three) times daily.  Marland Kitchen dicyclomine (BENTYL) 10 MG capsule Take 1 capsule (10 mg total) by mouth 3 (three) times daily as needed for spasms.  Marland Kitchen omeprazole (PRILOSEC) 40 MG capsule Take 1 capsule (40 mg total) by mouth 2 (two) times daily.  . rizatriptan (MAXALT-MLT) 10 MG disintegrating tablet Take 10 mg by mouth daily as needed for migraine. May repeat in 2 hours if needed   No facility-administered encounter medications on file as of 10/08/2015.     Allergies as of 10/08/2015  . (No Known Allergies)    History reviewed. No pertinent past medical history.  History reviewed. No pertinent surgical history.  Family History  Problem Relation Age of Onset  . Throat cancer Father 67  . Hypertension Mother     Social History   Social History  .  Marital status: Married    Spouse name: N/A  . Number of children: 2  . Years of education: N/A   Occupational History  . driver    Social History Main Topics  . Smoking status: Never Smoker  . Smokeless tobacco: Never Used  . Alcohol use No  . Drug use: No  . Sexual activity: Not on file   Other Topics Concern  . Not on file   Social History Narrative   Married, lives with spouse and 2 children   Works as a Biochemist, clinical   No recent travel     Review of systems: Review of Systems  Constitutional: Negative for fever and chills.  HENT: Negative.   Eyes: Negative for blurred vision.  Respiratory: as per HPI  Cardiovascular: Negative for chest pain and palpitations.  Gastrointestinal: Negative for vomiting, diarrhea, blood per rectum. Genitourinary: Negative for dysuria, urgency, frequency and hematuria.  Musculoskeletal: Negative for myalgias, back pain and joint pain.  Skin: Negative for itching and rash.  Neurological: Negative for dizziness, tremors, focal weakness, seizures and loss of consciousness.  Endo/Heme/Allergies: Negative for environmental allergies.  Psychiatric/Behavioral: Negative for depression, suicidal ideas and hallucinations.  All other systems reviewed and are negative.   Physical Exam: Blood pressure 114/82, pulse 63, height 6' (1.829 m), weight 85.7 kg (189 lb), SpO2 98 %. Gen:      No acute distress HEENT:  EOMI, sclera anicteric Neck:     No masses; no thyromegaly Lungs:  Clear to auscultation bilaterally; normal respiratory effort CV:         Regular rate and rhythm; no murmurs Abd:      + bowel sounds; soft, non-tender; no palpable masses, no distension Ext:    No edema; adequate peripheral perfusion Skin:      Warm and dry; no rash Neuro: alert and oriented x 3 Psych: normal mood and affect  Data Reviewed: CXR 06/12/15. No acute cardiopulmonary disease. Images reviewed.  PFTs 10/08/15 CC 5.68 [106%] FEV1 4.67 [130%) F/F 82 TLC  190% DLCO 85%. Normal spirometry  Assessment:  Chronic cough  Likely upper airway cough syndrome. He may have a component of LPR but has not responded to high dose PPI.He is not taking the chlorpheniramine as instructed. I asked him to start taking the antihistamine three times daily and continue the dymisa. He will call us in 2 weeks to report if there is any change in symptoms  PFTs were reviewed with him today. Although there is no overt obstruction by F/F criteria he does have significant improvement in mid flow rated post albuterol suggestive of small airway disease. If the above approach does not work then in 2 weeks I will give him a short course of prednisone and inhaler for treatment of reactive airway disease. Check FENO to assess airway inflammation.  I re educated him on behavioral changes to deal with cough including conscious suppression of the urge to cough, use of throat lozenges.  Plan/Recommendations: - Chlorpeniramine 8 mg tid - Dymista nasal spray - Check FENO  Report back in 2 weeks.   Marshell Garfinkel MD [facility] Pager [phone] 10/08/2015, 11:52 AM  CC: Merrilee Seashore, MD

## 2015-10-13 ENCOUNTER — Ambulatory Visit: Payer: Commercial Managed Care - PPO | Admitting: Pulmonary Disease

## 2015-10-22 ENCOUNTER — Telehealth: Payer: Self-pay | Admitting: Pulmonary Disease

## 2015-10-22 NOTE — Telephone Encounter (Signed)
Called and spoke to pt. Pt states he has been following the recommendations per PM 9.21.17 OV but his cough is unchanged. Pt states he is taking Chlorpheniramine 8mg  TID and Dymista 2-3 times per day. Pt requesting recs from PM.   Dr. Vaughan Browner please advise. Thanks.   Instructions  Please stop the zyrtec and start chlorpheniramine 8 mg tid. Continue the dymista nasal spray. Please call with me in 2 weeks to report any change in symptoms.  PFTs reviewed with you today.   Return in 1 months

## 2015-10-23 MED ORDER — PREDNISONE 10 MG PO TABS: ORAL_TABLET | ORAL | 0 refills | Status: DC

## 2015-10-23 MED ORDER — BUDESONIDE-FORMOTEROL FUMARATE 160-4.5 MCG/ACT IN AERO: 2.0000 | INHALATION_SPRAY | Freq: Two times a day (BID) | RESPIRATORY_TRACT | 6 refills | Status: AC

## 2015-10-23 NOTE — Telephone Encounter (Signed)
Please send in a prednisone taper at 40 mg. Reduce dose by 10 mg every 3 days.  Start symbicort 160/4.5 bid. We will reassess response at next office visit

## 2015-10-23 NOTE — Telephone Encounter (Signed)
Pt aware of rec's per PM Pred and Symbicort called into [facility].  Pt refused to come by the office for inhaler teaching - advised pt to have pharmacist teach inhaler technique when he picks up his Rx's to ensure he is using it properly. Pt expressed understanding. Instructions put on Symbicort Rx to show patient how to use inhaler.  Nothing further needed.

## 2015-10-23 NOTE — Telephone Encounter (Signed)
Pt calling again.  Please advise Dr Vaughan Browner. Thanks.

## 2015-11-03 ENCOUNTER — Ambulatory Visit: Payer: Commercial Managed Care - PPO | Admitting: Gastroenterology

## 2015-11-11 ENCOUNTER — Ambulatory Visit: Payer: Commercial Managed Care - PPO | Admitting: Pulmonary Disease

## 2015-11-23 ENCOUNTER — Encounter: Payer: Self-pay | Admitting: Pulmonary Disease

## 2015-11-23 ENCOUNTER — Ambulatory Visit (INDEPENDENT_AMBULATORY_CARE_PROVIDER_SITE_OTHER): Payer: Commercial Managed Care - PPO | Admitting: Pulmonary Disease

## 2015-11-23 VITALS — BP 110/66 | HR 67 | Ht 74.0 in | Wt 196.0 lb

## 2015-11-23 DIAGNOSIS — R059 Cough, unspecified: Secondary | ICD-10-CM

## 2015-11-23 DIAGNOSIS — R05 Cough: Secondary | ICD-10-CM

## 2015-11-23 NOTE — Patient Instructions (Addendum)
Continue using the current treatment for cough. I will see you in office in 6 months. Call sooner if symptoms worsen.

## 2015-11-23 NOTE — Progress Notes (Signed)
Dean Francis    [id number]    Nov 04, 1960  Primary Care Physician:RAMACHANDRAN,AJITH, MD  Referring Physician: Merrilee Seashore, MD [address] [place], [place] [address]  Chief complaint:   Follow up for chronic cough  HPI: Dean Francis is a 55 year old with no significant past medical history. He said cough since March 2017. He thinks this may have been set off by allergies, pollen. It was associated with dyspnea, wheezing. Respiratory symptoms have resolved but the cough still persists. He was evaluated by GI Dr. Fuller Plan and started on PPI bid with no improvement in symptoms. He was also evaluated by ENT Dr. Constance Holster who did not find any abnormality.  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 His symptoms are finally starting to improve. He still has some persistent cough but doesn't seem to bother him a whole lot.   Outpatient Encounter Prescriptions as of 11/23/2015  Medication Sig  . Azelastine-Fluticasone (DYMISTA) 137-50 MCG/ACT SUSP Place 1 spray into the nose 2 (two) times daily.  . budesonide-formoterol (SYMBICORT) 160-4.5 MCG/ACT inhaler Inhale 2 puffs into the lungs 2 (two) times daily.  . chlorpheniramine (CHLOR-TRIMETON) 4 MG tablet Take 2 tablets (8 mg total) by mouth 3 (three) times daily.  Marland Kitchen dicyclomine (BENTYL) 10 MG capsule Take 1 capsule (10 mg total) by mouth 3 (three) times daily as needed for spasms.  Marland Kitchen omeprazole (PRILOSEC) 40 MG capsule Take 1 capsule (40 mg total) by mouth 2 (two) times daily.  . rizatriptan (MAXALT-MLT) 10 MG disintegrating tablet Take 10 mg by mouth daily as needed for migraine. May repeat in 2 hours if needed  . [DISCONTINUED] predniSONE (DELTASONE) 10 MG tablet Take 4 tablets po x 3 days, then 3 x 3 days, then 2 x 3 days, then 1 x 3 days then stop (Patient not taking: Reported on 11/23/2015)   No  facility-administered encounter medications on file as of 11/23/2015.     Allergies as of 11/23/2015  . (No Known Allergies)    History reviewed. No pertinent past medical history.  History reviewed. No pertinent surgical history.  Family History  Problem Relation Age of Onset  . Throat cancer Father 66  . Hypertension Mother     Social History   Social History  . Marital status: Married    Spouse name: N/A  . Number of children: 2  . Years of education: N/A   Occupational History  . driver    Social History Main Topics  . Smoking status: Never Smoker  . Smokeless tobacco: Never Used  . Alcohol use No  . Drug use: No  . Sexual activity: Not on file   Other Topics Concern  . Not on file   Social History Narrative   Married, lives with spouse and 2 children   Works as a Biochemist, clinical   No recent travel     Review of systems: Review of Systems  Constitutional: Negative for fever and chills.  HENT: Negative.   Eyes: Negative for blurred vision.  Respiratory: as per HPI  Cardiovascular: Negative for chest pain and palpitations.  Gastrointestinal: Negative for vomiting, diarrhea, blood per rectum. Genitourinary: Negative for dysuria, urgency, frequency and hematuria.  Musculoskeletal: Negative for myalgias, back pain and joint pain.  Skin: Negative for itching and rash.  Neurological: Negative for dizziness, tremors, focal weakness, seizures and loss of consciousness.  Endo/Heme/Allergies: Negative  for environmental allergies.  Psychiatric/Behavioral: Negative for depression, suicidal ideas and hallucinations.  All other systems reviewed and are negative.   Physical Exam: Blood pressure 114/82, pulse 63, height 6' (1.829 m), weight 85.7 kg (189 lb), SpO2 98 %. Gen:      No acute distress HEENT:  EOMI, sclera anicteric Neck:     No masses; no thyromegaly Lungs:    Clear to auscultation bilaterally; normal respiratory effort CV:         Regular rate and  rhythm; no murmurs Abd:      + bowel sounds; soft, non-tender; no palpable masses, no distension Ext:    No edema; adequate peripheral perfusion Skin:      Warm and dry; no rash Neuro: alert and oriented x 3 Psych: normal mood and affect  Data Reviewed: CXR 06/12/15. No acute cardiopulmonary disease. Images reviewed.  PFTs 10/08/15 CC 5.68 [106%] FEV1 4.67 [130%) F/F 82 TLC 190% DLCO 85%. Normal spirometry  FENO 10/08/15- 23  Assessment:  Chronic cough Likely upper airway cough syndrome. He may have a component of LPR but has not responded to high dose PPI. 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 The cough which had been resistant to the treatments is finally starting to improve. PFTs were reviewed with him. Although there is no overt obstruction by F/F criteria he does have significant improvement in mid flow rated post albuterol suggestive of small airway disease.   I re educated him on behavioral changes to deal with cough including conscious suppression of the urge to cough, use of throat lozenges.  Plan/Recommendations: - Chlorpeniramine 8 mg tid, dymista - Continue symbicort and prilosec  Marshell Garfinkel MD [facility] Pager [phone] 11/23/2015, 4:52 PM  CC: Merrilee Seashore, MD

## 2015-11-24 MED ORDER — AZELASTINE-FLUTICASONE 137-50 MCG/ACT NA SUSP: 2.0000 | Freq: Every day | NASAL | 0 refills | Status: AC

## 2015-11-24 MED ORDER — BUDESONIDE-FORMOTEROL FUMARATE 160-4.5 MCG/ACT IN AERO: 2.0000 | INHALATION_SPRAY | Freq: Two times a day (BID) | RESPIRATORY_TRACT | 0 refills | Status: AC

## 2016-05-21 DIAGNOSIS — L821 Other seborrheic keratosis: Secondary | ICD-10-CM | POA: Diagnosis not present

## 2016-05-21 DIAGNOSIS — L989 Disorder of the skin and subcutaneous tissue, unspecified: Secondary | ICD-10-CM | POA: Diagnosis not present

## 2016-05-21 DIAGNOSIS — H029 Unspecified disorder of eyelid: Secondary | ICD-10-CM | POA: Diagnosis not present

## 2016-07-27 ENCOUNTER — Other Ambulatory Visit: Payer: Self-pay | Admitting: Gastroenterology

## 2016-11-03 DIAGNOSIS — Z0000 Encounter for general adult medical examination without abnormal findings: Secondary | ICD-10-CM | POA: Diagnosis not present

## 2016-11-03 DIAGNOSIS — Z125 Encounter for screening for malignant neoplasm of prostate: Secondary | ICD-10-CM | POA: Diagnosis not present

## 2016-11-23 DIAGNOSIS — G43009 Migraine without aura, not intractable, without status migrainosus: Secondary | ICD-10-CM | POA: Diagnosis not present

## 2016-11-23 DIAGNOSIS — K219 Gastro-esophageal reflux disease without esophagitis: Secondary | ICD-10-CM | POA: Diagnosis not present

## 2016-11-23 DIAGNOSIS — Z0000 Encounter for general adult medical examination without abnormal findings: Secondary | ICD-10-CM | POA: Diagnosis not present

## 2017-01-23 ENCOUNTER — Other Ambulatory Visit: Payer: Self-pay | Admitting: Gastroenterology

## 2017-01-24 ENCOUNTER — Other Ambulatory Visit: Payer: Self-pay | Admitting: Gastroenterology

## 2017-01-28 ENCOUNTER — Other Ambulatory Visit: Payer: Self-pay | Admitting: Gastroenterology

## 2017-03-11 ENCOUNTER — Other Ambulatory Visit: Payer: Self-pay | Admitting: Gastroenterology

## 2017-11-08 DIAGNOSIS — Z0000 Encounter for general adult medical examination without abnormal findings: Secondary | ICD-10-CM | POA: Diagnosis not present

## 2017-11-08 DIAGNOSIS — Z125 Encounter for screening for malignant neoplasm of prostate: Secondary | ICD-10-CM | POA: Diagnosis not present

## 2017-11-21 ENCOUNTER — Encounter: Payer: Self-pay | Admitting: Gastroenterology

## 2017-11-27 DIAGNOSIS — Z0000 Encounter for general adult medical examination without abnormal findings: Secondary | ICD-10-CM | POA: Diagnosis not present

## 2017-11-27 DIAGNOSIS — R002 Palpitations: Secondary | ICD-10-CM | POA: Diagnosis not present

## 2017-11-27 DIAGNOSIS — K219 Gastro-esophageal reflux disease without esophagitis: Secondary | ICD-10-CM | POA: Diagnosis not present

## 2017-11-28 ENCOUNTER — Other Ambulatory Visit: Payer: Self-pay | Admitting: Gastroenterology

## 2017-12-25 DIAGNOSIS — Z1211 Encounter for screening for malignant neoplasm of colon: Secondary | ICD-10-CM | POA: Diagnosis not present

## 2017-12-25 DIAGNOSIS — Z800 Family history of malignant neoplasm of digestive organs: Secondary | ICD-10-CM | POA: Diagnosis not present

## 2017-12-25 DIAGNOSIS — Z86010 Personal history of colonic polyps: Secondary | ICD-10-CM | POA: Diagnosis not present

## 2018-01-21 ENCOUNTER — Encounter: Payer: Self-pay | Admitting: Gastroenterology

## 2018-01-24 IMAGING — US US ABDOMEN COMPLETE
1 series · 13 of 25 positions shown · non-contrast
Comparison: CT abdomen and pelvis April 28, 2009

CLINICAL DATA: Right-sided abdominal pain with nausea for 3 months

EXAM:
ABDOMEN ULTRASOUND COMPLETE

[Series 1: us abdomen complete · 0.19mm/px · 13 of 107 slices shown]
[im 1/107]
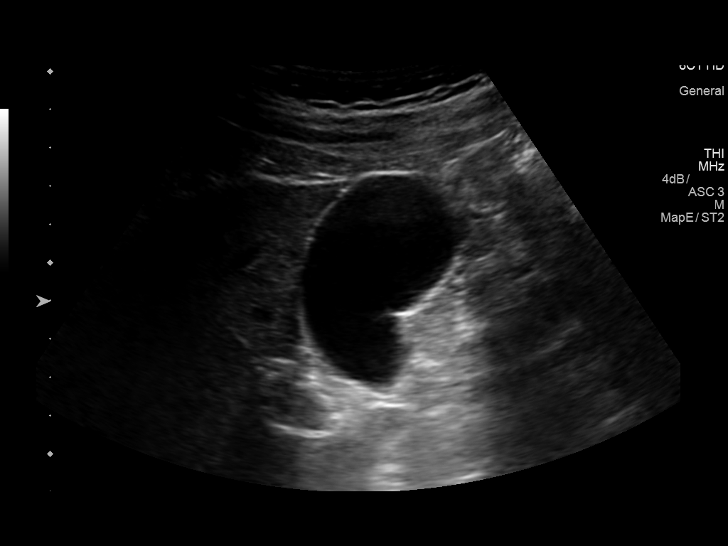
[im 9/107]
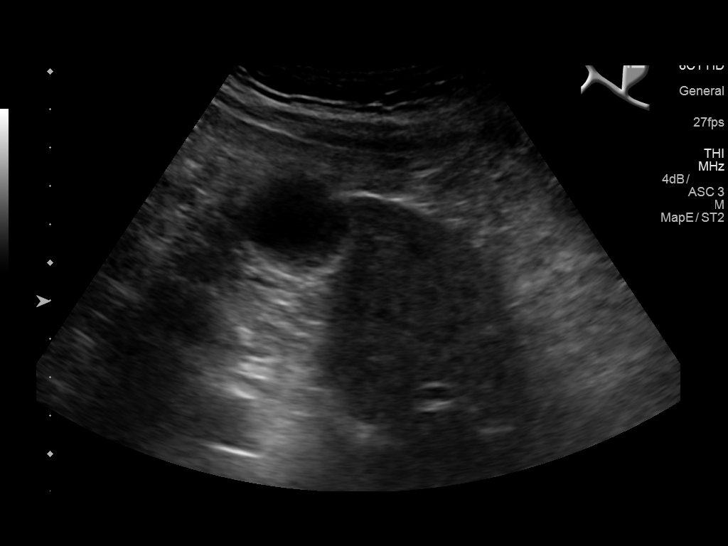
[im 18/107]
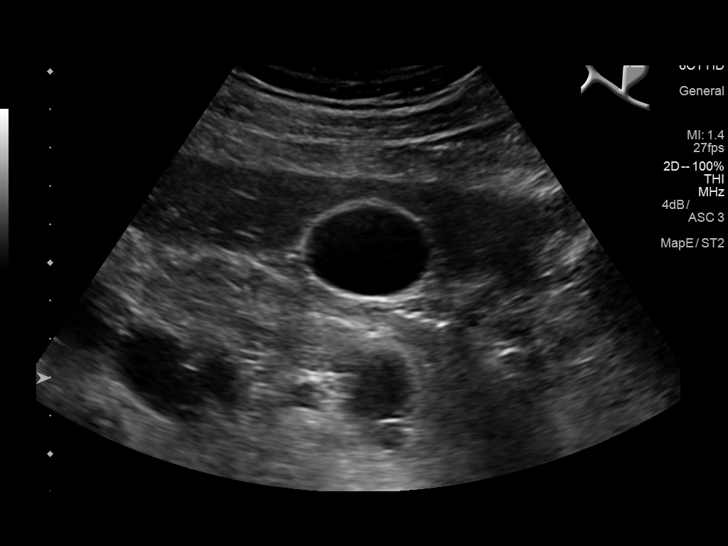
[im 27/107]
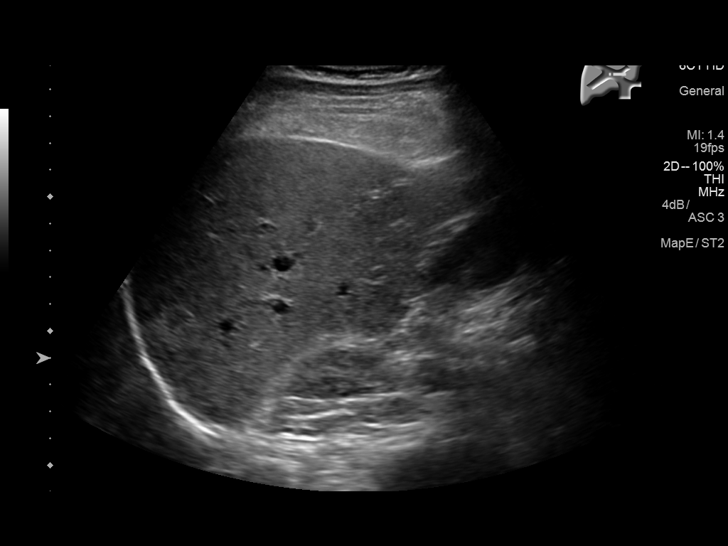
[im 36/107]
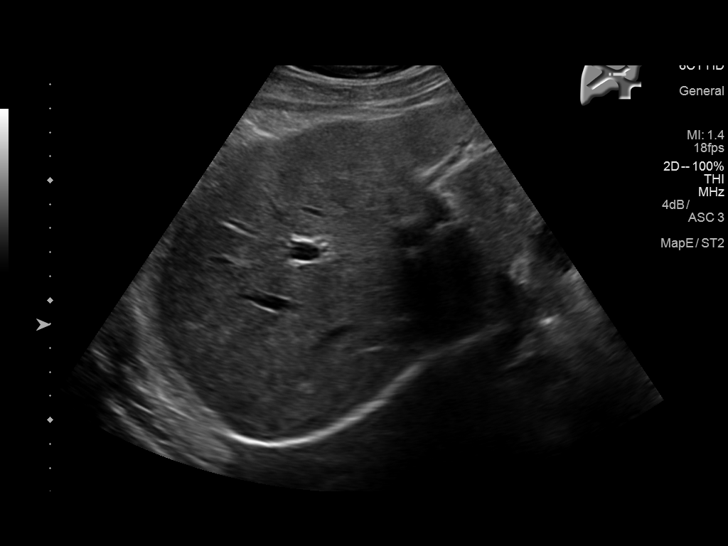
[im 45/107]
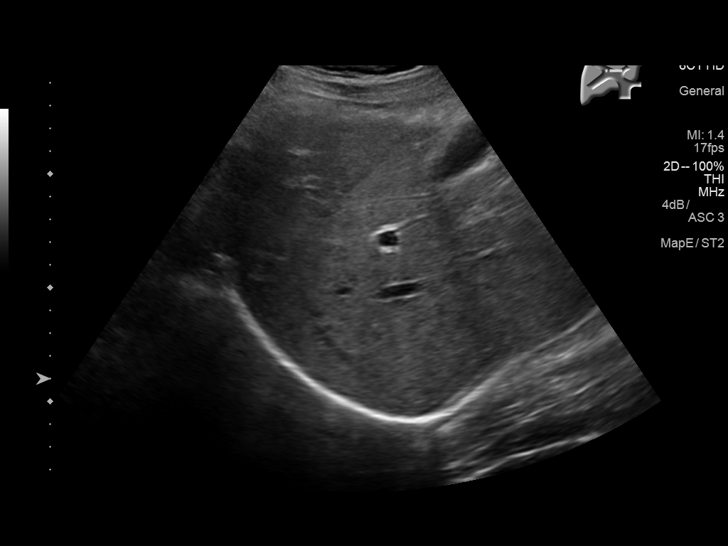
[im 54/107]
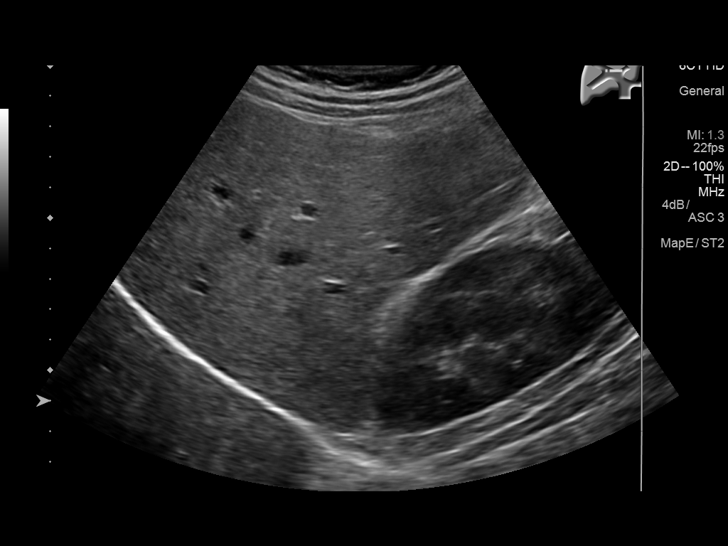
[im 62/107]
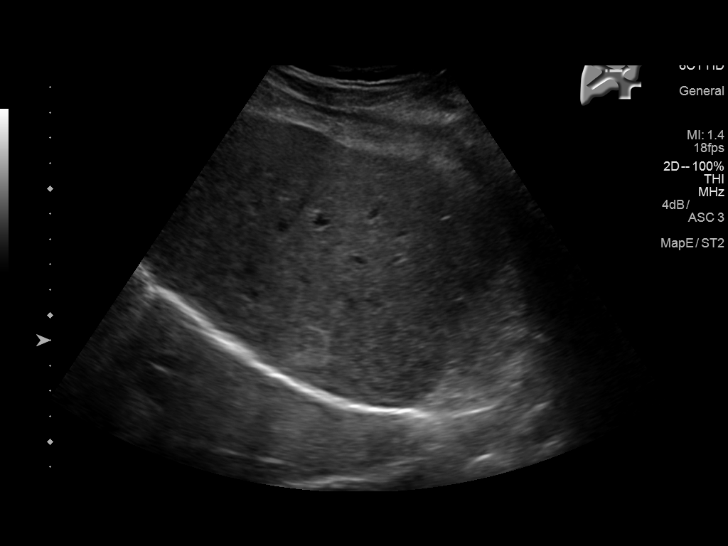
[im 71/107]
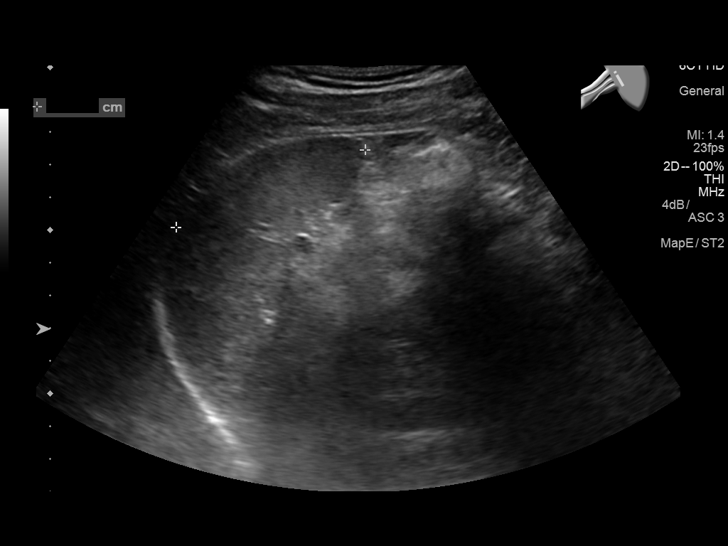
[im 80/107]
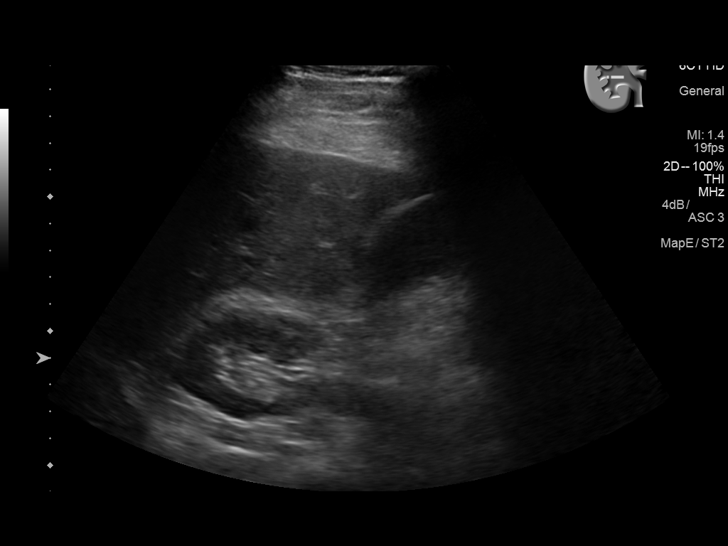
[im 89/107]
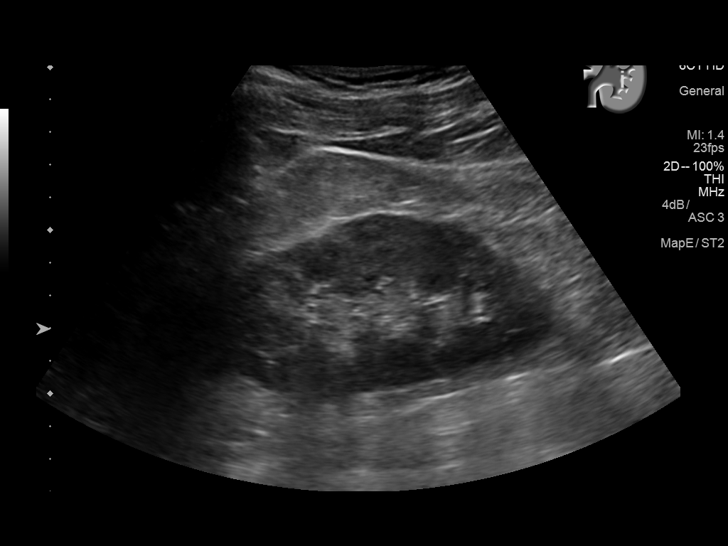
[im 98/107]
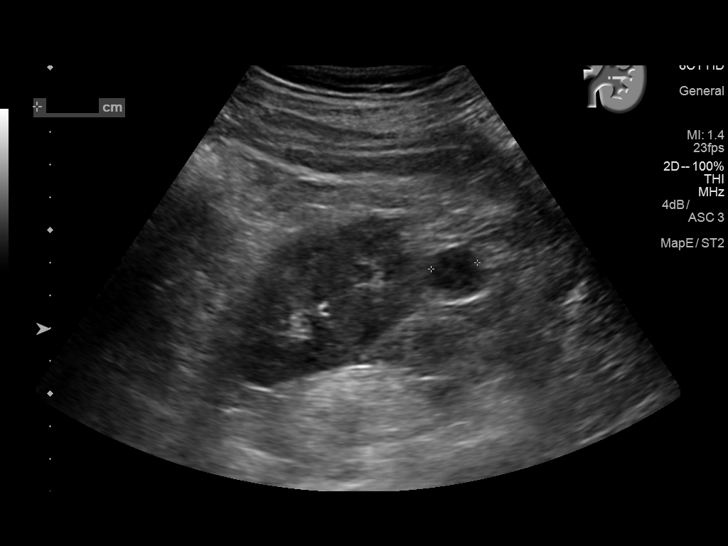
[im 107/107]
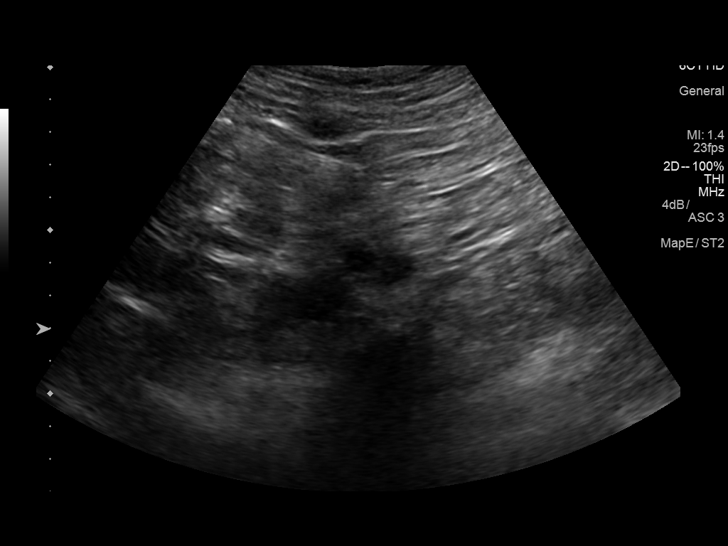

[13 of 25 positions shown; findings below may reference images not displayed]

FINDINGS: Gallbladder: No gallstones or wall thickening visualized. There is
no pericholecystic fluid. No sonographic Murphy sign noted by
sonographer.

Common bile duct: Diameter: 3 mm. There is no intrahepatic, common
hepatic, or common bile duct dilatation.

Liver: There is a solid mass in the right lobe of the liver near the
dome posteriorly measuring 1.8 x 1.7 x 1.7 cm, stable from the 5599
CT examination. Within normal limits in parenchymal echogenicity.

IVC: No abnormality visualized.

Pancreas: Visualized portion unremarkable. Portions the pancreatic
tail are obscured by gas.

Spleen: Size and appearance within normal limits.

Right Kidney: Length: 10.4 cm. Echogenicity within normal limits. No
mass or hydronephrosis visualized.

Left Kidney: Length: 11.3 cm. Echogenicity within normal limits. No
hydronephrosis visualized. There is a cyst arising from the lower
pole left kidney measuring 1.6 x 1.6 x 1.4 cm.

Abdominal aorta: No aneurysm visualized.

Other findings: No demonstrable ascites.
IMPRESSION: Stable apparent hemangioma in the right lobe of the liver near the
dome. Small left renal cyst. Study otherwise unremarkable. Note that
portions of the pancreas are obscured by gas. Visualized portions of
pancreas appear normal.

## 2018-03-15 IMAGING — CR DG CHEST 2V
2 series · 2 of 2 positions shown · non-contrast
Comparison: None.

CLINICAL DATA: Chronic dry cough for 12 weeks

EXAM:
CHEST  2 VIEW

[w chest pa]
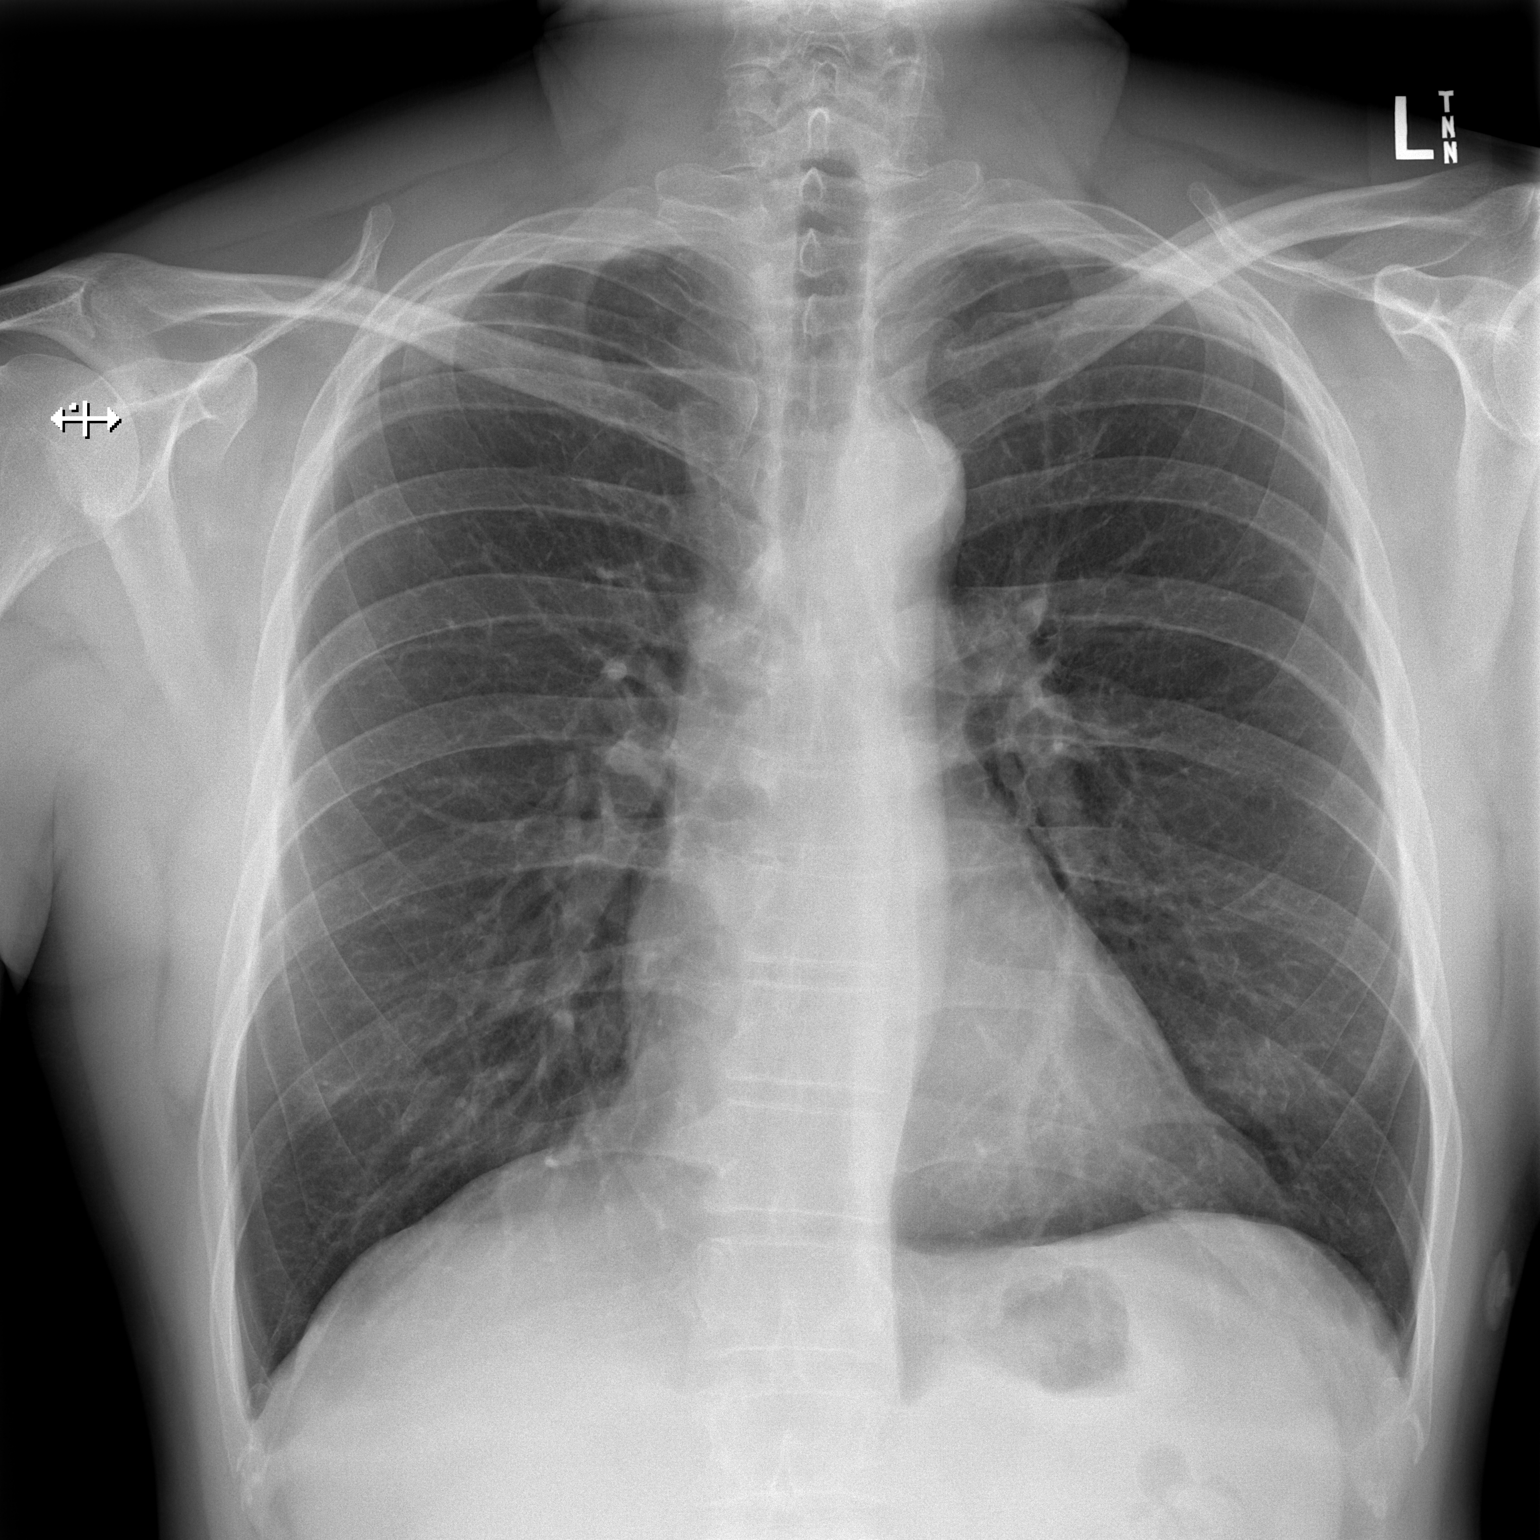

[w chest lat]
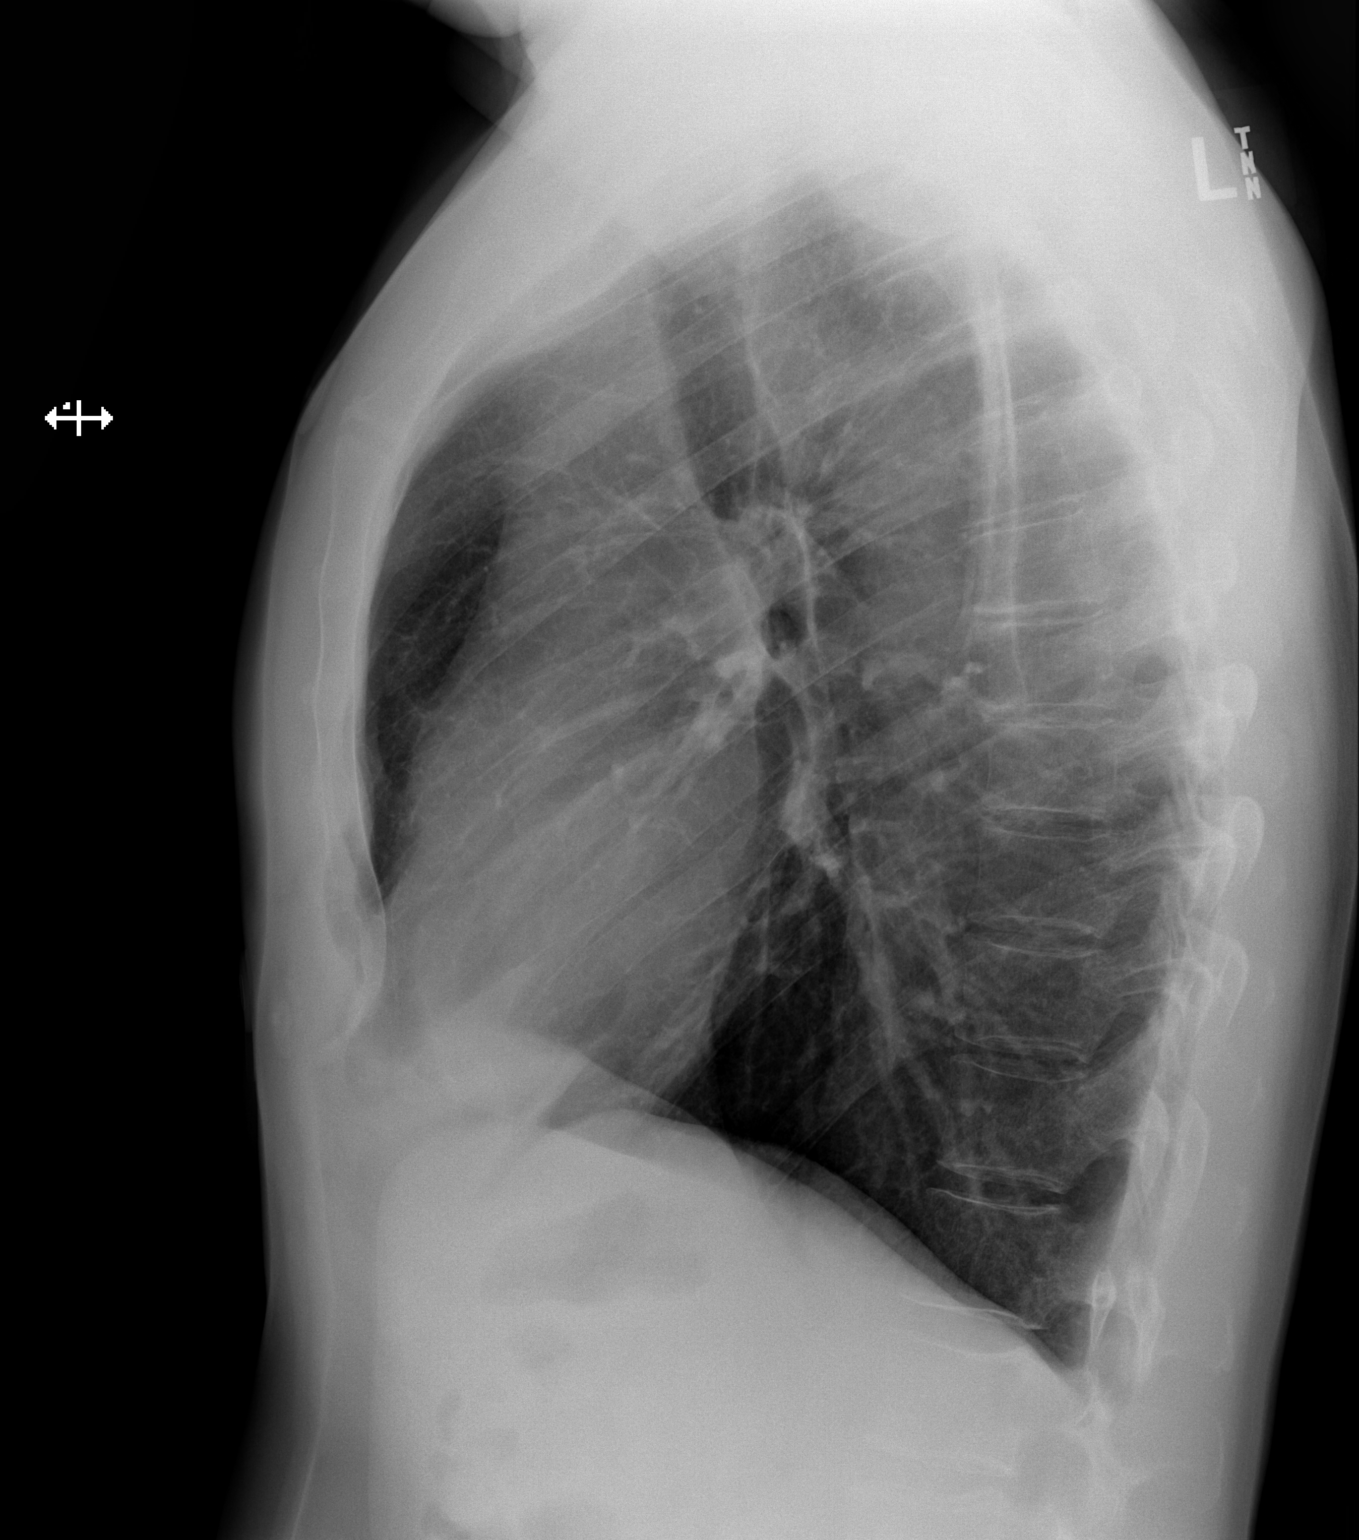

[2 of 2 positions shown; findings below may reference images not displayed]

FINDINGS: The heart size and mediastinal contours are within normal limits.
Both lungs are clear. The visualized skeletal structures are
unremarkable.
IMPRESSION: No active cardiopulmonary disease.

## 2018-05-18 DIAGNOSIS — E782 Mixed hyperlipidemia: Secondary | ICD-10-CM | POA: Diagnosis not present

## 2018-12-01 ENCOUNTER — Other Ambulatory Visit: Payer: Self-pay | Admitting: *Deleted

## 2018-12-01 DIAGNOSIS — Z20828 Contact with and (suspected) exposure to other viral communicable diseases: Secondary | ICD-10-CM

## 2018-12-01 DIAGNOSIS — Z20822 Contact with and (suspected) exposure to covid-19: Secondary | ICD-10-CM

## 2018-12-04 LAB — NOVEL CORONAVIRUS, NAA: SARS-CoV-2, NAA: NOT DETECTED

## 2019-05-28 ENCOUNTER — Ambulatory Visit: Payer: Commercial Managed Care - PPO | Admitting: Podiatry

## 2019-05-28 ENCOUNTER — Other Ambulatory Visit: Payer: Self-pay | Admitting: Podiatry

## 2019-05-28 ENCOUNTER — Ambulatory Visit (INDEPENDENT_AMBULATORY_CARE_PROVIDER_SITE_OTHER): Payer: Commercial Managed Care - PPO

## 2019-05-28 ENCOUNTER — Other Ambulatory Visit: Payer: Self-pay

## 2019-05-28 DIAGNOSIS — M19072 Primary osteoarthritis, left ankle and foot: Secondary | ICD-10-CM

## 2019-05-28 DIAGNOSIS — M79672 Pain in left foot: Secondary | ICD-10-CM

## 2019-05-28 DIAGNOSIS — M25572 Pain in left ankle and joints of left foot: Secondary | ICD-10-CM

## 2019-05-28 DIAGNOSIS — M19079 Primary osteoarthritis, unspecified ankle and foot: Secondary | ICD-10-CM

## 2019-06-25 ENCOUNTER — Ambulatory Visit: Payer: Commercial Managed Care - PPO | Admitting: Podiatry

## 2019-07-31 NOTE — Progress Notes (Signed)
  Subjective:  Patient ID: Dean Francis, male    DOB: 1960/09/14,  MRN: [id number]  Chief Complaint  Patient presents with  . Foot Pain    59 y.o. male presents with the above complaint. History confirmed with patient. Reports pain to the top of the left foot, for 2-3 months. 5/10 dulll ache. Comes and goes. Deneis swelling. Has been using OTC topical pain cream. Wose with tight shoes or laces. Use meloxicam without relief.  Objective:  Physical Exam: warm, good capillary refill, no trophic changes or ulcerative lesions, normal DP and PT pulses and normal sensory exam. Left Foot: POP dorsal idfoot with prominent exostosis  Right Foot: normal exam, no swelling, tenderness, instability; ligaments intact, full range of motion of all ankle/foot joints   No images are attached to the encounter.  Radiographs: X-ray of the left foot: midfoot degenerative changes no acute fractures or dislocations Assessment:   1. Arthritis of midfoot   2. Pain in joint of left foot    Plan:  Patient was evaluated and treated and all questions answered.  Arthritis -Educated on etiology -XR reviewed with patient -Injection delivered to the painful joint Procedure: Joint Injection Location: Left dorsal 2nd TMT joint Skin Prep: Alcohol. Injectate: 0.5 cc 1% lidocaine plain, 0.5 cc dexamethasone phosphate. Disposition: Patient tolerated procedure well. Injection site dressed with a band-aid.   No follow-ups on file.

## 2019-08-06 ENCOUNTER — Other Ambulatory Visit: Payer: Self-pay | Admitting: Podiatry

## 2019-08-06 DIAGNOSIS — M19079 Primary osteoarthritis, unspecified ankle and foot: Secondary | ICD-10-CM

## 2020-08-18 ENCOUNTER — Other Ambulatory Visit (HOSPITAL_BASED_OUTPATIENT_CLINIC_OR_DEPARTMENT_OTHER): Payer: Self-pay | Admitting: Internal Medicine

## 2020-08-18 DIAGNOSIS — R2241 Localized swelling, mass and lump, right lower limb: Secondary | ICD-10-CM
